# Patient Record
Sex: Female | Born: 1970 | Race: White | Hispanic: No | Smoking: Never smoker
Health system: Southern US, Community
[De-identification: ages and names within clinical notes are randomized; demographics above are authoritative.]

## PROBLEM LIST (undated history)

## (undated) DIAGNOSIS — Z8669 Personal history of other diseases of the nervous system and sense organs: Secondary | ICD-10-CM

## (undated) DIAGNOSIS — K219 Gastro-esophageal reflux disease without esophagitis: Secondary | ICD-10-CM

## (undated) DIAGNOSIS — K429 Umbilical hernia without obstruction or gangrene: Secondary | ICD-10-CM

## (undated) HISTORY — PX: TONSILLECTOMY AND ADENOIDECTOMY: SUR1326

## (undated) HISTORY — PX: BREAST ENHANCEMENT SURGERY: SHX7

## (undated) HISTORY — PX: COLONOSCOPY: SHX174

## (undated) HISTORY — PX: LYMPH NODE BIOPSY: SHX201

---

## 2007-08-14 ENCOUNTER — Emergency Department: Payer: Self-pay | Admitting: Internal Medicine

## 2008-04-24 IMAGING — CT CT STONE STUDY
1 of 2 series · 15 of 32 positions shown, 19 images · non-contrast
Comparison: none

REASON FOR EXAM: flank stone r
COMMENTS:

PROCEDURE:     CT  - CT ABDOMEN /PELVIS WO (STONE)  - August 14, 2007  [DATE]
RESULT:     Comparison: No available comparison exam.
TECHNIQUE: CT examination of the abdomen and pelvis was performed without
contrast. Collimation is 3 mm.

[Series 2: stone · axial · 0.70mm/px · z∈[+66,+484]mm · 15 of 156 slices shown, 19 images]
[im 11/156  soft-tissue]
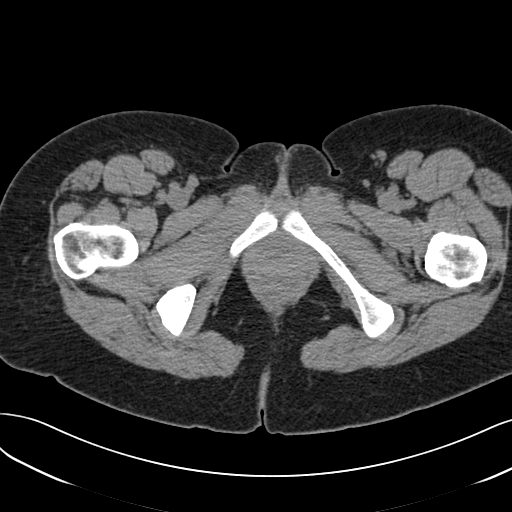
[im 11/156  bone]
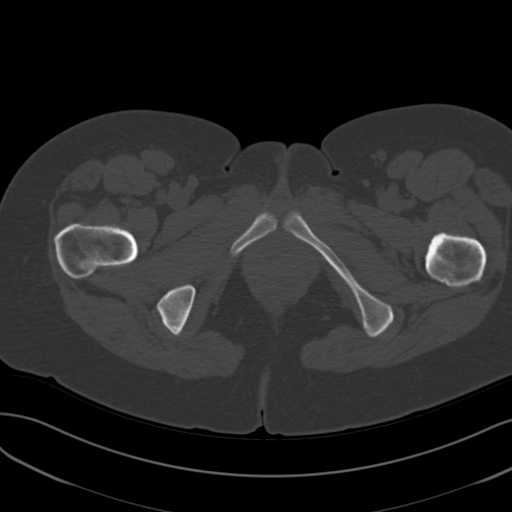
[im 22/156  soft-tissue]
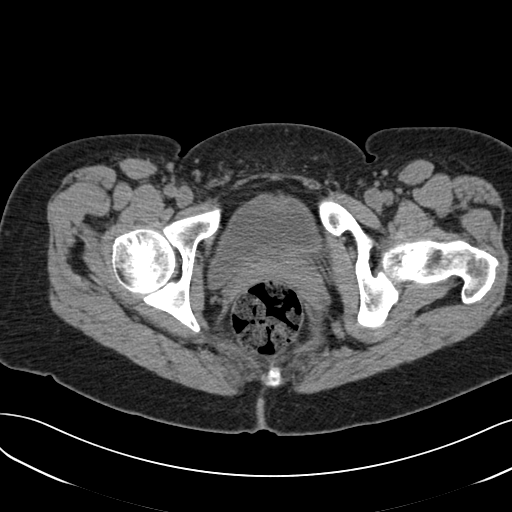
[im 33/156  soft-tissue]
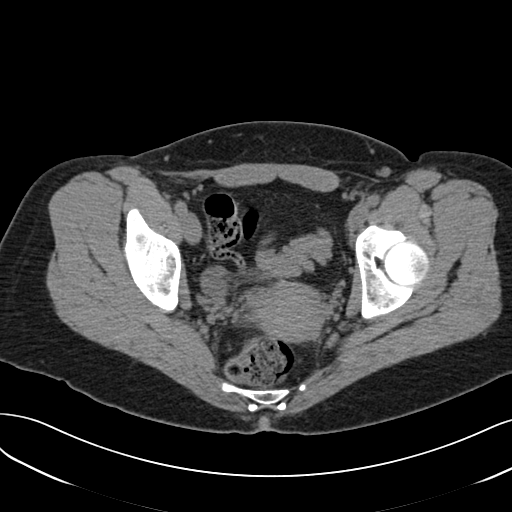
[im 43/156  soft-tissue]
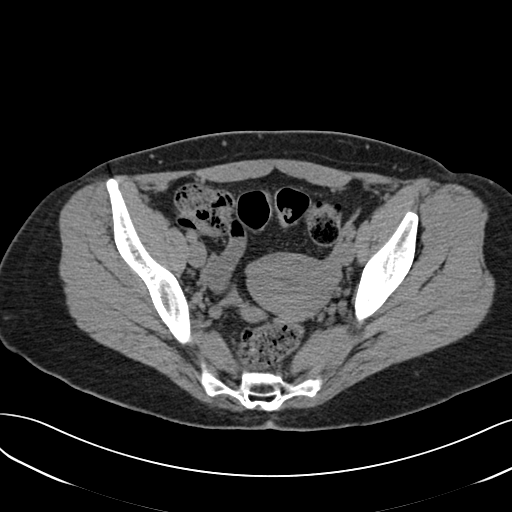
[im 54/156  soft-tissue]
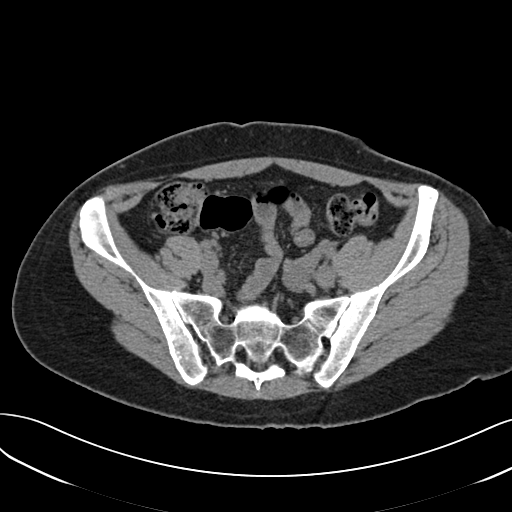
[im 65/156  soft-tissue]
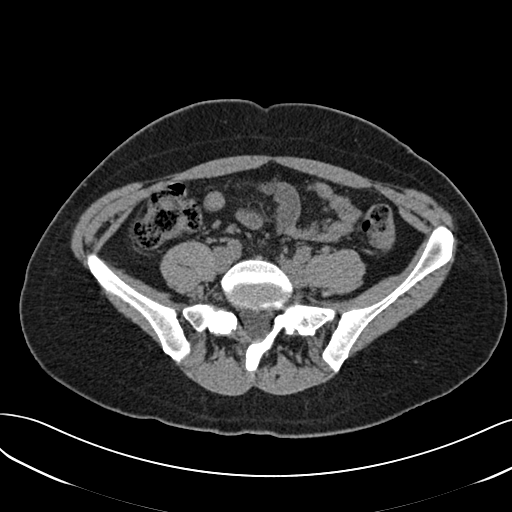
[im 81/156  soft-tissue]
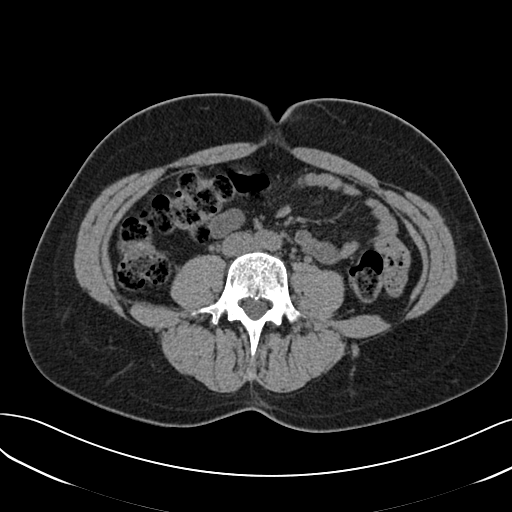
[im 91/156  soft-tissue]
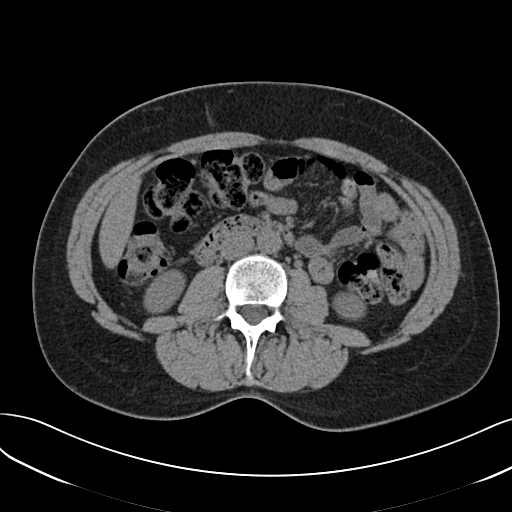
[im 102/156  soft-tissue]
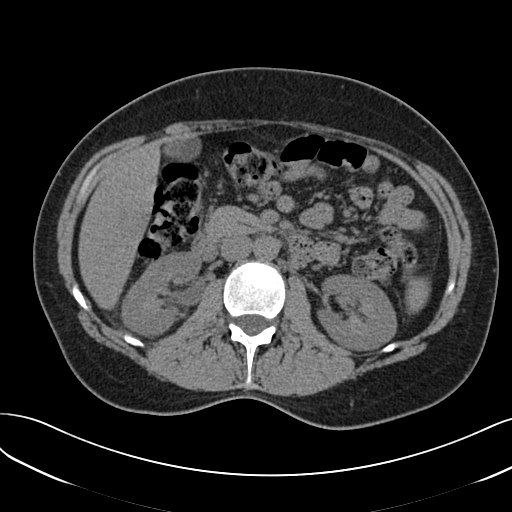
[im 102/156  bone]
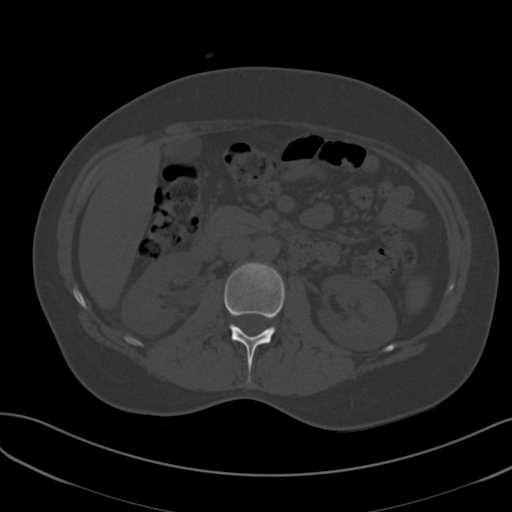
[im 113/156  soft-tissue]
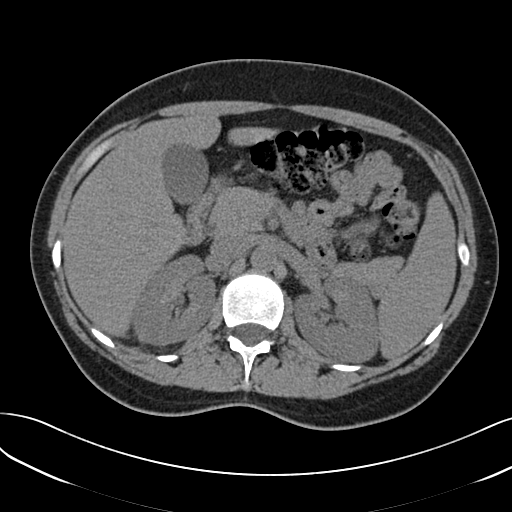
[im 123/156  soft-tissue]
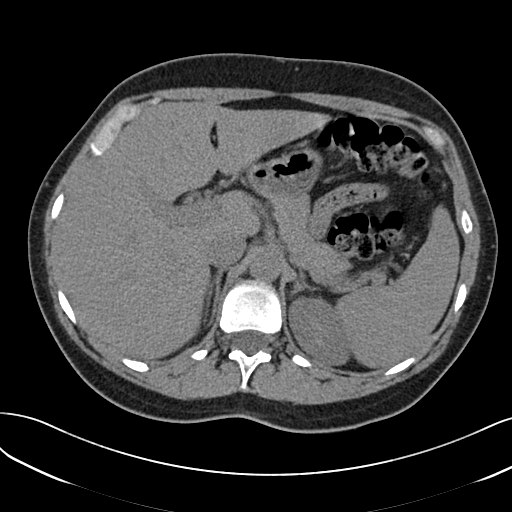
[im 134/156  soft-tissue]
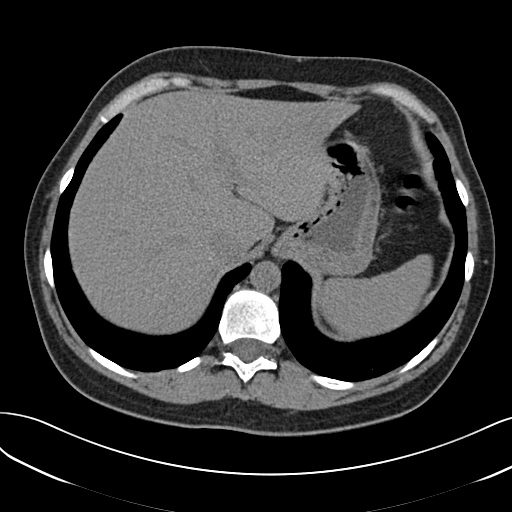
[im 134/156  lung]
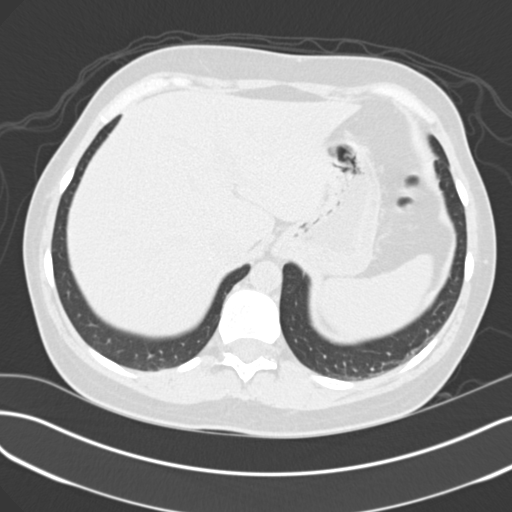
[im 139/156  lung]
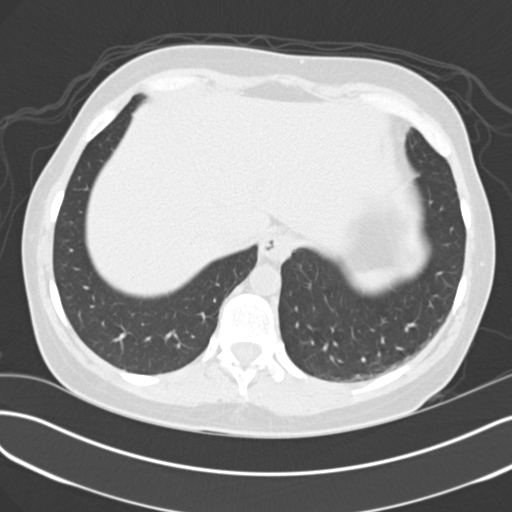
[im 145/156  soft-tissue]
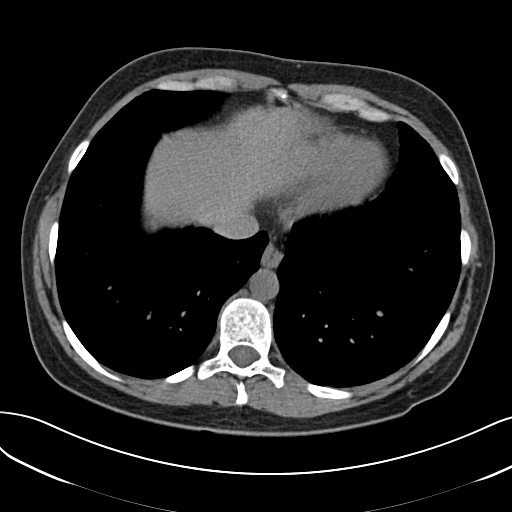
[im 145/156  lung]
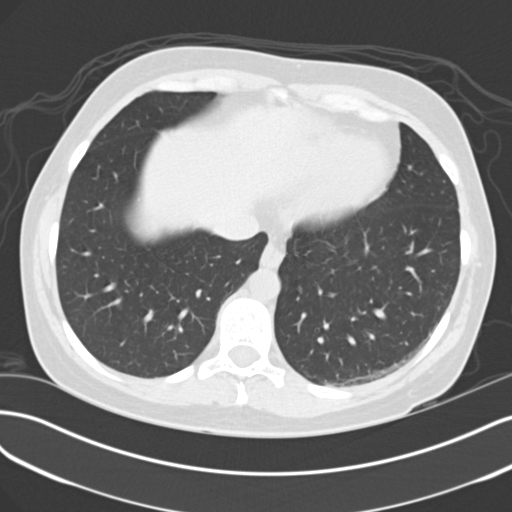
[im 150/156  lung]
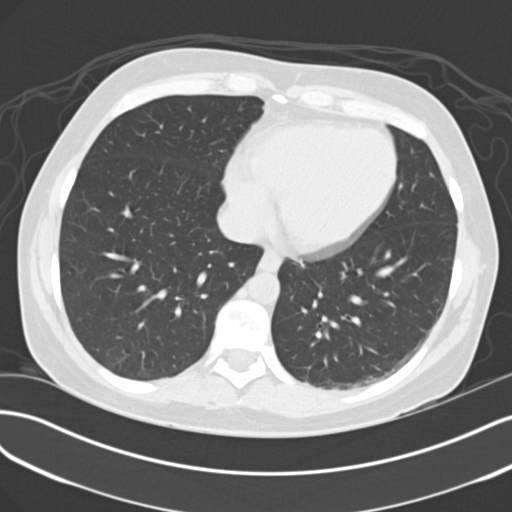

[15 of 32 positions shown; findings below may reference images not displayed]

FINDINGS: Limited evaluation of the lung bases is unremarkable

Evaluation of the abdominal organs, bowels, and vessels is limited without
contrast. The liver, gallbladder, pancreas, and adrenal glands are grossly
unremarkable. The spleen is borderline in size, measuring 13 cm in the AP
dimension. There is a nonobstructive 4 mm right interpolar renal stone. No
ureteral stone is noted. There is no hydroureter or hydronephrosis.

There is no dilatation of the bowels. The appendix is unremarkable. There is
no significant intra abdominal or pelvic fat stranding. There is no
intraperitoneal free air. There is no significant free fluid. There are no
enlarged abdominal pelvic lymph nodes.
IMPRESSION: 1. 4 mm nonobstructive right interpolar renal stone. No ureteral stone is
noted.
2. Borderline sized spleen.
3. Evaluation of the abdominal organs, bowels, and vessels is otherwise
limited without contrast.

## 2011-09-28 ENCOUNTER — Ambulatory Visit: Payer: Self-pay

## 2011-09-28 LAB — HEMATOCRIT: HCT: 39.1 % (ref 35.0–47.0)

## 2011-09-28 LAB — HCG, QUANTITATIVE, PREGNANCY: Beta Hcg, Quant.: <1 m[IU]/mL — ABNORMAL LOW

## 2011-10-07 ENCOUNTER — Ambulatory Visit: Payer: Self-pay

## 2011-10-07 LAB — PREGNANCY, URINE: Pregnancy Test, Urine: NEGATIVE m[IU]/mL

## 2011-10-11 LAB — PATHOLOGY REPORT

## 2014-12-28 NOTE — Op Note (Signed)
PATIENT NAME:  Emma Gross, Emma Gross MR#:  [id number] DATE OF BIRTH:  [birth year]  DATE OF PROCEDURE:  10/07/2011  PREOPERATIVE DIAGNOSIS: Cervical dysplasia.   POSTOPERATIVE DIAGNOSIS: Cervical dysplasia.  PROCEDURE PERFORMED: LEEP cone biopsy, dilation and curettage.   SURGEON: Deloris Pinghilip J. Rosenow, MD  DESCRIPTION OF PROCEDURE: After adequate general anesthesia, the patient was prepped and draped in routine fashion. Approximately 10 mL of 1% Xylocaine with 1:100,000 units of epinephrine were instilled in the cervix. A LEEP cone biopsy was then performed in a "[place] fashion".  The cervix was dilated easily. The uterine cavity was systematically curetted with return of a small amount of tissue. The base of cone biopsy was cauterized with ball cautery. The base of the cone biopsy was then painted with AstrinGyn. The patient tolerated the procedure well and left the Operating Room in good condition. Sponge and needle counts were said to be correct at the end of the procedure. Estimated blood loss was minimal.  ____________________________ Deloris PingPhilip J. Luella Cookosenow, MD pjr:slb D: 10/07/2011 09:18:57 ET T: 10/07/2011 12:07:43 ET JOB#: [id number]  cc: Deloris PingPhilip J. Luella Cookosenow, MD, <Dictator> Towana BadgerPHILIP J ROSENOW MD ELECTRONICALLY SIGNED 10/08/2011 0:21

## 2017-10-12 ENCOUNTER — Other Ambulatory Visit: Payer: Self-pay | Admitting: Nurse Practitioner

## 2017-10-12 DIAGNOSIS — M25511 Pain in right shoulder: Secondary | ICD-10-CM

## 2017-11-13 ENCOUNTER — Other Ambulatory Visit: Payer: Self-pay | Admitting: Orthopedic Surgery

## 2017-11-13 DIAGNOSIS — M7501 Adhesive capsulitis of right shoulder: Secondary | ICD-10-CM

## 2017-11-20 ENCOUNTER — Other Ambulatory Visit: Payer: Self-pay | Admitting: Internal Medicine

## 2017-11-21 ENCOUNTER — Ambulatory Visit: Payer: Self-pay

## 2017-11-21 ENCOUNTER — Other Ambulatory Visit: Payer: Self-pay | Admitting: *Deleted

## 2017-12-08 ENCOUNTER — Ambulatory Visit: Payer: Self-pay

## 2017-12-08 ENCOUNTER — Ambulatory Visit
Admission: RE | Admit: 2017-12-08 | Discharge: 2017-12-08 | Disposition: A | Discharge status: Home / Self Care | Payer: BLUE CROSS/BLUE SHIELD | Source: Ambulatory Visit | Attending: Orthopedic Surgery | Admitting: Orthopedic Surgery

## 2017-12-08 DIAGNOSIS — M7501 Adhesive capsulitis of right shoulder: Secondary | ICD-10-CM | POA: Diagnosis not present

## 2017-12-08 MED ORDER — LIDOCAINE HCL (PF) 1 % IJ SOLN
5.0000 mL | Freq: Once | INTRAMUSCULAR | Status: AC
  Administered 2017-12-08: 5 mL
  Filled 2017-12-08: qty 5

## 2017-12-08 MED ORDER — TRIAMCINOLONE ACETONIDE 40 MG/ML IJ SUSP (RADIOLOGY)
40.0000 mg | Freq: Once | INTRAMUSCULAR | Status: AC
  Administered 2017-12-08: 40 mg via INTRA_ARTICULAR

## 2017-12-08 MED ORDER — ROPIVACAINE HCL 5 MG/ML IJ SOLN
30.0000 mL | Freq: Once | INTRAMUSCULAR | Status: AC
  Administered 2017-12-08: 10 mL via INTRA_ARTICULAR
  Filled 2017-12-08: qty 30

## 2017-12-08 MED ORDER — IOPAMIDOL (ISOVUE-200) INJECTION 41%
50.0000 mL | Freq: Once | INTRAVENOUS | Status: AC | PRN
  Administered 2017-12-08: 10 mL
  Filled 2017-12-08: qty 50

## 2021-04-21 ENCOUNTER — Other Ambulatory Visit: Payer: Self-pay | Admitting: Certified Nurse Midwife

## 2021-04-21 DIAGNOSIS — Z1231 Encounter for screening mammogram for malignant neoplasm of breast: Secondary | ICD-10-CM

## 2021-12-05 ENCOUNTER — Encounter: Payer: Self-pay | Admitting: Family Medicine

## 2021-12-05 ENCOUNTER — Emergency Department: Payer: Managed Care, Other (non HMO)

## 2021-12-05 ENCOUNTER — Inpatient Hospital Stay
Admission: EM | Admit: 2021-12-05 | Discharge: 2021-12-07 | DRG: 378 | Disposition: A | Discharge status: Home / Self Care | Payer: Managed Care, Other (non HMO) | Attending: Internal Medicine | Admitting: Internal Medicine

## 2021-12-05 ENCOUNTER — Other Ambulatory Visit: Payer: Self-pay

## 2021-12-05 DIAGNOSIS — G4700 Insomnia, unspecified: Secondary | ICD-10-CM

## 2021-12-05 DIAGNOSIS — K529 Noninfective gastroenteritis and colitis, unspecified: Secondary | ICD-10-CM | POA: Diagnosis not present

## 2021-12-05 DIAGNOSIS — K625 Hemorrhage of anus and rectum: Principal | ICD-10-CM

## 2021-12-05 DIAGNOSIS — G43909 Migraine, unspecified, not intractable, without status migrainosus: Secondary | ICD-10-CM | POA: Diagnosis present

## 2021-12-05 DIAGNOSIS — K921 Melena: Principal | ICD-10-CM | POA: Diagnosis present

## 2021-12-05 DIAGNOSIS — Z823 Family history of stroke: Secondary | ICD-10-CM | POA: Diagnosis not present

## 2021-12-05 DIAGNOSIS — G43009 Migraine without aura, not intractable, without status migrainosus: Secondary | ICD-10-CM | POA: Diagnosis not present

## 2021-12-05 DIAGNOSIS — K922 Gastrointestinal hemorrhage, unspecified: Secondary | ICD-10-CM

## 2021-12-05 DIAGNOSIS — K559 Vascular disorder of intestine, unspecified: Secondary | ICD-10-CM | POA: Diagnosis present

## 2021-12-05 DIAGNOSIS — Z882 Allergy status to sulfonamides status: Secondary | ICD-10-CM | POA: Diagnosis not present

## 2021-12-05 DIAGNOSIS — Z8249 Family history of ischemic heart disease and other diseases of the circulatory system: Secondary | ICD-10-CM

## 2021-12-05 DIAGNOSIS — F5101 Primary insomnia: Secondary | ICD-10-CM | POA: Diagnosis not present

## 2021-12-05 DIAGNOSIS — E876 Hypokalemia: Secondary | ICD-10-CM

## 2021-12-05 HISTORY — DX: Noninfective gastroenteritis and colitis, unspecified: K52.9

## 2021-12-05 LAB — CBC
HCT: 46.3 % — ABNORMAL HIGH (ref 36.0–46.0)
Hemoglobin: 15.4 g/dL — ABNORMAL HIGH (ref 12.0–15.0)
MCH: 31 pg (ref 26.0–34.0)
MCHC: 33.3 g/dL (ref 30.0–36.0)
MCV: 93.2 fL (ref 80.0–100.0)
Platelets: 362 10*3/uL (ref 150–400)
RBC: 4.97 MIL/uL (ref 3.87–5.11)
RDW: 13 % (ref 11.5–15.5)
WBC: 8.7 10*3/uL (ref 4.0–10.5)
nRBC: 0 % (ref 0.0–0.2)

## 2021-12-05 LAB — COMPREHENSIVE METABOLIC PANEL
ALT: 11 U/L (ref 0–44)
AST: 21 U/L (ref 15–41)
Albumin: 4.3 g/dL (ref 3.5–5.0)
Alkaline Phosphatase: 52 U/L (ref 38–126)
Anion gap: 8 (ref 5–15)
BUN: 11 mg/dL (ref 6–20)
CO2: 23 mmol/L (ref 22–32)
Calcium: 9 mg/dL (ref 8.9–10.3)
Chloride: 105 mmol/L (ref 98–111)
Creatinine, Ser: 0.94 mg/dL (ref 0.44–1.00)
GFR, Estimated: >60 mL/min (ref >60)
Glucose, Bld: 107 mg/dL — ABNORMAL HIGH (ref 70–99)
Potassium: 3 mmol/L — ABNORMAL LOW (ref 3.5–5.1)
Sodium: 136 mmol/L (ref 135–145)
Total Bilirubin: 1.1 mg/dL (ref 0.3–1.2)
Total Protein: 8.5 g/dL — ABNORMAL HIGH (ref 6.5–8.1)

## 2021-12-05 LAB — TYPE AND SCREEN
ABO/RH(D): O NEG
Antibody Screen: NEGATIVE

## 2021-12-05 LAB — PROTIME-INR
INR: 1 (ref 0.8–1.2)
Prothrombin Time: 12.8 seconds (ref 11.4–15.2)

## 2021-12-05 LAB — HEMOGLOBIN AND HEMATOCRIT, BLOOD
HCT: 43.3 % (ref 36.0–46.0)
Hemoglobin: 14.4 g/dL (ref 12.0–15.0)

## 2021-12-05 MED ORDER — AMOXICILLIN-POT CLAVULANATE 875-125 MG PO TABS
1.0000 | ORAL_TABLET | Freq: Once | ORAL | Status: AC
  Administered 2021-12-05: 1 via ORAL
  Filled 2021-12-05: qty 1

## 2021-12-05 MED ORDER — TOPIRAMATE 25 MG PO TABS
25.0000 mg | ORAL_TABLET | Freq: Two times a day (BID) | ORAL | Status: DC
  Administered 2021-12-05 – 2021-12-07 (×4): 25 mg via ORAL
  Filled 2021-12-05 (×4): qty 1

## 2021-12-05 MED ORDER — SODIUM CHLORIDE 0.9 % IV SOLN
3.0000 g | Freq: Four times a day (QID) | INTRAVENOUS | Status: DC
  Administered 2021-12-06 (×2): 3 g via INTRAVENOUS
  Filled 2021-12-05 (×3): qty 8
  Filled 2021-12-05: qty 3

## 2021-12-05 MED ORDER — IOHEXOL 350 MG/ML SOLN
100.0000 mL | Freq: Once | INTRAVENOUS | Status: AC | PRN
  Administered 2021-12-05: 100 mL via INTRAVENOUS

## 2021-12-05 MED ORDER — POTASSIUM CHLORIDE IN NACL 20-0.9 MEQ/L-% IV SOLN
INTRAVENOUS | Status: DC
  Filled 2021-12-05 (×6): qty 1000

## 2021-12-05 MED ORDER — TRAZODONE HCL 50 MG PO TABS
25.0000 mg | ORAL_TABLET | Freq: Every evening | ORAL | Status: DC | PRN
  Administered 2021-12-06: 25 mg via ORAL
  Filled 2021-12-05: qty 1

## 2021-12-05 MED ORDER — ACETAMINOPHEN 650 MG RE SUPP: 650.0000 mg | Freq: Four times a day (QID) | RECTAL | Status: DC | PRN

## 2021-12-05 MED ORDER — ACETAMINOPHEN 325 MG PO TABS: 650.0000 mg | ORAL_TABLET | Freq: Four times a day (QID) | ORAL | Status: DC | PRN

## 2021-12-05 MED ORDER — ONDANSETRON HCL 4 MG/2ML IJ SOLN: 4.0000 mg | Freq: Four times a day (QID) | INTRAMUSCULAR | Status: DC | PRN

## 2021-12-05 MED ORDER — ONDANSETRON HCL 4 MG PO TABS: 4.0000 mg | ORAL_TABLET | Freq: Four times a day (QID) | ORAL | Status: DC | PRN

## 2021-12-05 MED ORDER — MORPHINE SULFATE (PF) 2 MG/ML IV SOLN: 2.0000 mg | INTRAVENOUS | Status: DC | PRN

## 2021-12-05 NOTE — ED Provider Notes (Addendum)
? ?[facility] ?Provider Note ? ? ? Event Date/Time  ? First MD Initiated Contact with Patient 12/05/21 2042   ?  (approximate) ? ?History  ? ?Chief Complaint: Rectal Bleeding ? ?HPI ? ?Emma Gross is a 51 y.o. female with no significant past medical history who presents to the emergency department for rectal bleeding.  According to the patient she had a colonoscopy 11/19/2021 with no concerning findings.  No polyps nothing removed.  Patient states last night she developed rectal bleeding when she noticed some blood in her stool.  Today she states she has had 3-4 bowel movements of straight blood with no stool.  Bright red blood.  States some nausea but denies any vomiting.  Denies any abdominal pain.  No anticoagulation. ? ?Physical Exam  ? ?Triage Vital Signs: ?ED Triage Vitals  ?Enc Vitals Group  ?   BP 12/05/21 2033 (!) 189/94  ?   Pulse Rate 12/05/21 2033 (!) 103  ?   Resp 12/05/21 2033 20  ?   Temp 12/05/21 2033 97.9 ?F (36.6 ?C)  ?   Temp Source 12/05/21 2033 Oral  ?   SpO2 12/05/21 2033 100 %  ?   Weight 12/05/21 2034 200 lb (90.7 kg)  ?   Height 12/05/21 2034 5\' 8"  (1.727 m)  ?   Head Circumference --   ?   Peak Flow --   ?   Pain Score 12/05/21 2033 0  ?   Pain Loc --   ?   Pain Edu? --   ?   Excl. in GC? --   ? ? ?Most recent vital signs: ?Vitals:  ? 12/05/21 2033  ?BP: (!) 189/94  ?Pulse: (!) 103  ?Resp: 20  ?Temp: 97.9 ?F (36.6 ?C)  ?SpO2: 100%  ? ? ?General: Awake, no distress.  ?CV:  Good peripheral perfusion.  Regular rate and rhythm  ?Resp:  Normal effort.  Equal breath sounds bilaterally.  ?Abd:  No distention.  Soft, nontender.  No rebound or guarding. ? ? ?ED Results / Procedures / Treatments  ? ?RADIOLOGY ? ?I personally reviewed the CT imaging, no significant finding on my evaluation. ?Radiology is read the CT is possible descending colitis.  I do also state possibility of a neoplasm however patient just had a colonoscopy 2 weeks ago. ? ? ?MEDICATIONS ORDERED IN  ED: ?Medications - No data to display ? ? ?IMPRESSION / MDM / ASSESSMENT AND PLAN / ED COURSE  ?I reviewed the triage vital signs and the nursing notes. ? ?Patient presents to the emergency department for rectal bleeding.  Patient had a colonoscopy 11/19/2021 with no concerning findings per patient.  Nothing removed.  Patient states rectal bleeding since last night.  Benign abdomen.  Patient did have a bowel movement in the emergency department of bright red blood.  We will obtain labs including coags, CT angio GI protocol of the abdomen and continue to closely monitor.  Given the patient's rectal bleeding anticipate admission to the hospital for further work-up and treatment. ? ?CT is read as possible colitis.  We will cover with Cipro and Flagyl.  Lab work is overall reassuring.  CBC shows normal H&H.  Chemistry is normal.  INR is normal.  We will admit to the hospitalist service for ongoing treatment and monitoring.  Patient agreeable to plan of care. ? ?FINAL CLINICAL IMPRESSION(S) / ED DIAGNOSES  ? ?GI bleeding ?Colitis ? ?Note:  This document was prepared using 11/21/2021  and may include unintentional dictation errors. ?  Minna Antis, MD ?12/05/21 2217 ? ?  ?Minna Antis, MD ?12/05/21 2217 ? ?

## 2021-12-05 NOTE — ED Triage Notes (Signed)
Rectal bleeding starting yesterday. Reports she had multiple episodes of diarrhea yesterday. No blood present in stool at that time. Then " blood had been just pouring out without any bowel movement." Pt had colonoscopy on 17th that was unremarkable. Having nausea and dizziness since onset of sx.  ?

## 2021-12-05 NOTE — ED Notes (Signed)
Request made for transport to the floor ?

## 2021-12-05 NOTE — Assessment & Plan Note (Addendum)
Takes Ambien at home. ?

## 2021-12-05 NOTE — H&P (Addendum)
?  ?  ?[place] ? ? ?PATIENT NAME: Emma Gross   ? ?MR#:  [id number] ? ?DATE OF BIRTH:  [birth year] ? ?DATE OF ADMISSION:  12/05/2021 ? ?PRIMARY CARE PHYSICIAN: Leanna Sato, MD  ? ?Patient is coming from: Home ? ?REQUESTING/REFERRING PHYSICIAN: Minna Antis, MD ? ?CHIEF COMPLAINT:  ? ?Chief Complaint  ?Patient presents with  ?? Rectal Bleeding  ? ? ?HISTORY OF PRESENT ILLNESS:  ?Emma Gross is a 51 y.o. Caucasian female with medical history significant for migraine, vitamin B12 deficiency and insomnia, who presented to the emergency room with acute onset of bright red bleeding per rectum.  The patient started having mixed stools with bright red blood yesterday and today had a it has been mainly bright red blood with associated abdominal cramps as well as nausea without vomiting or heartburn.  She denies any fever or chills.  She denied any dysuria, oliguria or hematuria, urgency or frequency or flank pain.  No cough or wheezing or hemoptysis.  No chest pain or palpitations.  No other bleeding diathesis.  She admits to feeling tired today.  No paresthesias or focal muscle weakness or headache or dizziness or presyncope or syncope. ? ?ED Course: When she came to the ER, BP was 189/94 with a heart rate of 103 with otherwise normal vital signs.  Labs revealed hypokalemia of 3 and otherwise unremarkable CMP.  CBC showed hemoglobin of 15.4 hematocrit 46.3.  PT was 12.8 and INR 1 ?Imaging: CTA of the abdomen and pelvis showed the following: ?1. No focal area of active gastrointestinal bleeding is ?demonstrated. ?2. Focal area of wall thickening with mild pericolonic stranding is ?suggested in the mid descending colon. This may indicate an area of ?inflammation such as diverticulitis or may indicate an underlying ?colon neoplasm. Consider endoscopy for further evaluation. No ?abscess. ?3. Nonobstructing intrarenal stones on the right ? ?The patient was given p.o. Augmentin in the ER.  She will be admitted to  a medical telemetry bed for further evaluation and management. ?PAST MEDICAL HISTORY:  ? ?Migraine, vitamin B12 deficiency and insomnia. ? ?PAST SURGICAL HISTORY:  ?Tonsillectomy and adenoidectomy ?Breast implants/augmentation ? ?SOCIAL HISTORY:  ? ?Social History  ? ?Tobacco Use  ?? Smoking status: Never  ?? Smokeless tobacco: Never  ?Substance Use Topics  ?? Alcohol use: Not on file  ?She drinks alcohol socially.  No history of tobacco abuse or illicit drug use. ? ?FAMILY HISTORY:  ?Positive for CVA in her father and peripheral vascular disease in her deceased mother who had lower extremity amputations. ? ?DRUG ALLERGIES:  ? ?Allergies  ?Allergen Reactions  ?? Sulfa Antibiotics   ? ? ?REVIEW OF SYSTEMS:  ? ?ROS ?As per history of present illness. All pertinent systems were reviewed above. Constitutional, HEENT, cardiovascular, respiratory, GI, GU, musculoskeletal, neuro, psychiatric, endocrine, integumentary and hematologic systems were reviewed and are otherwise negative/unremarkable except for positive findings mentioned above in the HPI. ? ? ?MEDICATIONS AT HOME:  ? ?Prior to Admission medications   ?Medication Sig Start Date End Date Taking? Authorizing Provider  ?phentermine (ADIPEX-P) 37.5 MG tablet Take 37.5 mg by mouth daily. 10/14/21   [provider]  ?topiramate (TOPAMAX) 25 MG tablet Take 25 mg by mouth 2 (two) times daily. 10/26/21   [provider]  ?zolpidem (AMBIEN CR) 12.5 MG CR tablet Take 12.5 mg by mouth at bedtime as needed. 11/09/21   [provider]  ? ?  ? ?VITAL SIGNS:  ?Blood pressure (!) 168/78, pulse 68,  temperature 98.9 ?F (37.2 ?C), temperature source Oral, resp. rate 18, height 5\' 8"  (1.727 m), weight 90.7 kg, SpO2 100 %. ? ?PHYSICAL EXAMINATION:  ?Physical Exam ? ?GENERAL:  51 y.o.-year-old Caucasian female patient lying in the bed with no acute distress.  ?EYES: Pupils equal, round, reactive to light and accommodation. No scleral icterus. Extraocular muscles  intact.  ?HEENT: Head atraumatic, normocephalic. Oropharynx and nasopharynx clear.  ?NECK:  Supple, no jugular venous distention. No thyroid enlargement, no tenderness.  ?LUNGS: Normal breath sounds bilaterally, no wheezing, rales,rhonchi or crepitation. No use of accessory muscles of respiration.  ?CARDIOVASCULAR: Regular rate and rhythm, S1, S2 normal. No murmurs, rubs, or gallops.  ?ABDOMEN: Soft, nondistended, with mild left upper and lower quadrant tenderness without rebound tenderness guarding or rigidity.. Bowel sounds present. No organomegaly or mass.  ?EXTREMITIES: No pedal edema, cyanosis, or clubbing.  ?NEUROLOGIC: Cranial nerves II through XII are intact. Muscle strength 5/5 in all extremities. Sensation intact. Gait not checked.  ?PSYCHIATRIC: The patient is alert and oriented x 3.  Normal affect and good eye contact. ?SKIN: No obvious rash, lesion, or ulcer.  ? ?LABORATORY PANEL:  ? ?CBC ?Recent Labs  ?Lab 12/05/21 ?2038 12/05/21 ?2324  ?WBC 8.7  --   ?HGB 15.4* 14.4  ?HCT 46.3* 43.3  ?PLT 362  --   ? ?------------------------------------------------------------------------------------------------------------------ ? ?Chemistries  ?Recent Labs  ?Lab 12/05/21 ?2038  ?NA 136  ?K 3.0*  ?CL 105  ?CO2 23  ?GLUCOSE 107*  ?BUN 11  ?CREATININE 0.94  ?CALCIUM 9.0  ?AST 21  ?ALT 11  ?ALKPHOS 52  ?BILITOT 1.1  ? ?------------------------------------------------------------------------------------------------------------------ ? ?Cardiac Enzymes ?No results for input(s): TROPONINI in the last 168 hours. ?------------------------------------------------------------------------------------------------------------------ ? ?RADIOLOGY:  ?CT ANGIO GI BLEED ? ?Result Date: 12/05/2021 ?CLINICAL DATA:  Lower GI bleed. Rectal bleeding starting yesterday. Multiple episodes of diarrhea yesterday. EXAM: CTA ABDOMEN AND PELVIS WITHOUT AND WITH CONTRAST TECHNIQUE: Multidetector CT imaging of the abdomen and pelvis was performed  using the standard protocol during bolus administration of intravenous contrast. Multiplanar reconstructed images and MIPs were obtained and reviewed to evaluate the vascular anatomy. RADIATION DOSE REDUCTION: This exam was performed according to the departmental dose-optimization program which includes automated exposure control, adjustment of the mA and/or kV according to patient size and/or use of iterative reconstruction technique. CONTRAST:  02/04/2022 OMNIPAQUE IOHEXOL 350 MG/ML SOLN COMPARISON:  CT 08/14/2007 FINDINGS: VASCULAR Aorta: Normal caliber aorta without aneurysm, dissection, vasculitis or significant stenosis. Celiac: Patent without evidence of aneurysm, dissection, vasculitis or significant stenosis. SMA: Patent without evidence of aneurysm, dissection, vasculitis or significant stenosis. Renals: Duplicated renal arteries bilaterally. Renal arteries are patent. No aneurysm, dissection, or significant stenosis. IMA: Patent without evidence of aneurysm, dissection, vasculitis or significant stenosis. Inflow: Patent without evidence of aneurysm, dissection, vasculitis or significant stenosis. Proximal Outflow: Bilateral common femoral and visualized portions of the superficial and profunda femoral arteries are patent without evidence of aneurysm, dissection, vasculitis or significant stenosis. Veins: No obvious venous abnormality within the limitations of this arterial phase study. Review of the MIP images confirms the above findings. NON-VASCULAR Lower chest: Mild dependent changes in the lung bases. Hepatobiliary: No focal liver abnormality is seen. No gallstones, gallbladder wall thickening, or biliary dilatation. Pancreas: Unremarkable. No pancreatic ductal dilatation or surrounding inflammatory changes. Spleen: Normal in size without focal abnormality. Adrenals/Urinary Tract: No adrenal gland nodules. Renal nephrograms are symmetrical. Right intrarenal stones are identified, largest measuring about 5  mm diameter. No hydronephrosis or hydroureter. No ureteral stones  are seen. Bladder is unremarkable. Stomach/Bowel: Residual contrast material in the cecum and ascending colon. Stomach, small bowel, and

## 2021-12-05 NOTE — Progress Notes (Signed)
Pharmacy Antibiotic Note ? ?Emma Gross is a 51 y.o. female admitted on 12/05/2021 with  intra-abdominal infection .  Pharmacy has been consulted for Unasyn dosing. ? ?Plan: ?Augmentin 875/125 mg given on 4/2 @ 2223. ?Unasyn 3 gm IV Q6H ordered to start on 4/3 @ 0400.  ? ?Height: 5\' 8"  (172.7 cm) ?Weight: 90.7 kg (200 lb) ?IBW/kg (Calculated) : 63.9 ? ?Temp (24hrs), Avg:97.9 ?F (36.6 ?C), Min:97.9 ?F (36.6 ?C), Max:97.9 ?F (36.6 ?C) ? ?Recent Labs  ?Lab 12/05/21 ?2038  ?WBC 8.7  ?CREATININE 0.94  ?  ?Estimated Creatinine Clearance: 82.4 mL/min (by C-G formula based on SCr of 0.94 mg/dL).   ? ?Allergies  ?Allergen Reactions  ? Sulfa Antibiotics   ? ? ?Antimicrobials this admission: ?  >>  ?  >>  ? ?Dose adjustments this admission: ? ? ?Microbiology results: ? BCx:  ? UCx:   ? Sputum:   ? MRSA PCR:  ? ?Thank you for allowing pharmacy to be a part of this patient?s care. ? ?Alyrica Thurow D ?12/05/2021 11:04 PM ? ?

## 2021-12-05 NOTE — Assessment & Plan Note (Addendum)
Try to limit Imitrex.  Continue Topamax ?

## 2021-12-05 NOTE — Assessment & Plan Note (Deleted)
Stool comprehensive panel negative.  Could be ischemic colitis.  Had recent colonoscopy.  On empiric Unasyn.  Full liquid diet.  Observing for now. Hemoglobin drifting down but getting IV fluids.  As per pharmacist case reports with ischemic colitis with sumatriptan use.  Continue to monitor. ?

## 2021-12-06 ENCOUNTER — Encounter: Payer: Self-pay | Admitting: Family Medicine

## 2021-12-06 DIAGNOSIS — K921 Melena: Principal | ICD-10-CM

## 2021-12-06 DIAGNOSIS — E876 Hypokalemia: Secondary | ICD-10-CM

## 2021-12-06 DIAGNOSIS — G4700 Insomnia, unspecified: Secondary | ICD-10-CM

## 2021-12-06 LAB — CBC
HCT: 41.8 % (ref 36.0–46.0)
Hemoglobin: 13.9 g/dL (ref 12.0–15.0)
MCH: 31 pg (ref 26.0–34.0)
MCHC: 33.3 g/dL (ref 30.0–36.0)
MCV: 93.1 fL (ref 80.0–100.0)
Platelets: 315 10*3/uL (ref 150–400)
RBC: 4.49 MIL/uL (ref 3.87–5.11)
RDW: 13.1 % (ref 11.5–15.5)
WBC: 6.1 10*3/uL (ref 4.0–10.5)
nRBC: 0 % (ref 0.0–0.2)

## 2021-12-06 LAB — BASIC METABOLIC PANEL
Anion gap: 7 (ref 5–15)
BUN: 10 mg/dL (ref 6–20)
CO2: 24 mmol/L (ref 22–32)
Calcium: 8.4 mg/dL — ABNORMAL LOW (ref 8.9–10.3)
Chloride: 107 mmol/L (ref 98–111)
Creatinine, Ser: 0.84 mg/dL (ref 0.44–1.00)
GFR, Estimated: >60 mL/min (ref >60)
Glucose, Bld: 97 mg/dL (ref 70–99)
Potassium: 3.2 mmol/L — ABNORMAL LOW (ref 3.5–5.1)
Sodium: 138 mmol/L (ref 135–145)

## 2021-12-06 LAB — GASTROINTESTINAL PANEL BY PCR, STOOL (REPLACES STOOL CULTURE)

## 2021-12-06 LAB — HEMOGLOBIN AND HEMATOCRIT, BLOOD
HCT: 40.4 % (ref 36.0–46.0)
Hemoglobin: 13.4 g/dL (ref 12.0–15.0)

## 2021-12-06 LAB — HIV ANTIBODY (ROUTINE TESTING W REFLEX): HIV Screen 4th Generation wRfx: NONREACTIVE

## 2021-12-06 LAB — MAGNESIUM: Magnesium: 2 mg/dL (ref 1.7–2.4)

## 2021-12-06 MED ORDER — SUMATRIPTAN SUCCINATE 50 MG PO TABS: 50.0000 mg | ORAL_TABLET | ORAL | Status: DC

## 2021-12-06 MED ORDER — POTASSIUM CHLORIDE CRYS ER 20 MEQ PO TBCR
40.0000 meq | EXTENDED_RELEASE_TABLET | Freq: Once | ORAL | Status: AC
Start: 2021-12-06 — End: 2021-12-06
  Administered 2021-12-06: 40 meq via ORAL
  Filled 2021-12-06: qty 2

## 2021-12-06 NOTE — Consult Note (Addendum)
? ? ?[facility] GI Inpatient Consult Note ? ? ?Kathline Magic, M.D. ? ?Reason for Consult: bloody diarrhea, colitis on CT ?  ?Attending Requesting Consult: Eugenie Norrie, M.D. ? ? ?History of Present Illness: ?Emma Gross is a 51 y.o. female is a pleasant female on whom I had the pleasure of performing: A screening colonoscopy on November 19, 2021.  The procedure was reviewed and noted to be normal except for a mid ascending colon, single diverticulum without evidence of diverticulitis.  No polyps, inflammation or masses were noted.  No specimens were collected.  No significant hemorrhoids were noted on retroflexed view. ?Patient had been doing well for the last couple of weeks since her colonoscopy when 2 days ago, while barbecuing outside, she noticed abdominal cramping after eating her meal.  She had significant fecal urgency and had "a large amount of brown diarrhea" followed by more abdominal cramping.  Patient took Pepto-Bismol without significant relief and then subsequently went to the bathroom and passing "large amount of red blood without any stool".  This continued throughout Saturday evening into Sunday.  Patient presented to the emergency room yesterday with complaints of lower abdominal cramping and continued bleeding although it had tapered off somewhat.  She was admitted to the hospital with observed hematochezia for further evaluation and management. ?Today, the patient has mild left lower quadrant cramping and had 1 dark red bowel movement this morning although the frequency is much less than yesterday.  She has no severe abdominal pain, nausea or vomiting. ?In regards to the cookout, the patient denies any other sick contacts or that anyone who ate with her also became ill.  It was her, her spouse and her in-laws over at the time and they all ate the same food. ? ?Past Medical History:  ?History reviewed. No pertinent past medical history.  ?Problem List: ?Patient Active Problem List  ?  Diagnosis Date Noted  ? Hypokalemia 12/06/2021  ? Acute hemorrhagic colitis 12/05/2021  ? Migraine 12/05/2021  ? Insomnia 12/05/2021  ?  ?Past Surgical History: ?History reviewed. No pertinent surgical history.  ?Allergies: ?Allergies  ?Allergen Reactions  ? Sulfa Antibiotics   ?  Other reaction(s): Unknown ?Childhood allergy  ?  ?Home Medications: ?Medications Prior to Admission  ?Medication Sig Dispense Refill Last Dose  ? phentermine (ADIPEX-P) 37.5 MG tablet Take 37.5 mg by mouth daily. (Patient not taking: Reported on 12/06/2021)   Not Taking  ? SUMAtriptan (IMITREX) 100 MG tablet Take 50 to 100 mg by mouth at onset of migraine. May repeat in 2 hours. Max of 2 doses per day.   prn at unknown  ? topiramate (TOPAMAX) 25 MG tablet Take 25 mg by mouth 2 (two) times daily.   prn at unknown  ? zolpidem (AMBIEN CR) 12.5 MG CR tablet Take 12.5 mg by mouth at bedtime as needed.   12/04/2021  ? ?Home medication reconciliation was completed with the patient.  ? ?Scheduled Inpatient Medications: ?  ? potassium chloride  40 mEq Oral Once  ? SUMAtriptan  50 mg Oral UD  ? topiramate  25 mg Oral BID  ? ? ?Continuous Inpatient Infusions: ?  ? 0.9 % NaCl with KCl 20 mEq / L 100 mL/hr at 12/05/21 2342  ? ampicillin-sulbactam (UNASYN) IV 3 g (12/06/21 0456)  ? ? ?PRN Inpatient Medications:  ?acetaminophen **OR** acetaminophen, morphine injection, ondansetron **OR** ondansetron (ZOFRAN) IV, traZODone ? ?Family History: ?family history is not on file.  ? ?GI Family History: Negative ? ?  Social History:  ? reports that she has never smoked. She has never used smokeless tobacco. The patient denies ETOH, tobacco, or drug use.  ? ? ?Review of Systems: Review of Systems - Negative except that in HPI. ? ?Physical Examination: ?BP (!) 141/85 (BP Location: Right Arm)   Pulse 70   Temp 98.9 ?F (37.2 ?C)   Resp 16   Ht 5\' 8"  (1.727 m)   Wt 90.7 kg   SpO2 97%   BMI 30.41 kg/m?  ?Physical Exam ?Vitals reviewed.  ?Constitutional:   ?    General: She is not in acute distress. ?   Appearance: Normal appearance. She is not ill-appearing or toxic-appearing.  ?HENT:  ?   Head: Normocephalic and atraumatic.  ?   Nose: Nose normal.  ?   Mouth/Throat:  ?   Mouth: Mucous membranes are dry.  ?Eyes:  ?   General: No scleral icterus.    ?   Right eye: No discharge.     ?   Left eye: No discharge.  ?   Pupils: Pupils are equal, round, and reactive to light.  ?Cardiovascular:  ?   Rate and Rhythm: Normal rate.  ?   Pulses: Normal pulses.  ?   Heart sounds: Normal heart sounds. No murmur heard. ?  No gallop.  ?Pulmonary:  ?   Effort: Pulmonary effort is normal.  ?   Breath sounds: Normal breath sounds. No wheezing.  ?Abdominal:  ?   General: Bowel sounds are normal.  ?   Palpations: Abdomen is soft. There is no mass.  ?   Tenderness: There is abdominal tenderness. There is no guarding or rebound.  ?   Hernia: No hernia is present.  ?Musculoskeletal:  ?   Cervical back: Normal range of motion.  ?Skin: ?   General: Skin is warm and dry.  ?   Capillary Refill: Capillary refill takes less than 2 seconds.  ?Neurological:  ?   General: No focal deficit present.  ?   Mental Status: She is alert and oriented to person, place, and time.  ?Psychiatric:     ?   Mood and Affect: Mood normal.     ?   Behavior: Behavior normal.     ?   Thought Content: Thought content normal.     ?   Judgment: Judgment normal.  ? ? ?Data: ?Lab Results  ?Component Value Date  ? WBC 6.1 12/06/2021  ? HGB 13.9 12/06/2021  ? HCT 41.8 12/06/2021  ? MCV 93.1 12/06/2021  ? PLT 315 12/06/2021  ? ?Recent Labs  ?Lab 12/05/21 ?2038 12/05/21 ?2324 12/06/21 ?[id number]  ?HGB 15.4* 14.4 13.9  ? ?Lab Results  ?Component Value Date  ? NA 138 12/06/2021  ? K 3.2 (L) 12/06/2021  ? CL 107 12/06/2021  ? CO2 24 12/06/2021  ? BUN 10 12/06/2021  ? CREATININE 0.84 12/06/2021  ? ?Lab Results  ?Component Value Date  ? ALT 11 12/05/2021  ? AST 21 12/05/2021  ? ALKPHOS 52 12/05/2021  ? BILITOT 1.1 12/05/2021  ? ?Recent Labs   ?Lab 12/05/21 ?2038  ?INR 1.0  ? ? ?  Latest Ref Rng & Units 12/06/2021  ?  5:04 AM 12/05/2021  ? 11:24 PM 12/05/2021  ?  8:38 PM  ?CBC  ?WBC 4.0 - 10.5 K/uL 6.1    8.7    ?Hemoglobin 12.0 - 15.0 g/dL 13.9   14.4   15.4    ?Hematocrit 36.0 - 46.0 % 41.8  43.3   46.3    ?Platelets 150 - 400 K/uL 315    362    ? ? ?STUDIES: ?CT ANGIO GI BLEED ? ?Result Date: 12/05/2021 ?CLINICAL DATA:  Lower GI bleed. Rectal bleeding starting yesterday. Multiple episodes of diarrhea yesterday. EXAM: CTA ABDOMEN AND PELVIS WITHOUT AND WITH CONTRAST TECHNIQUE: Multidetector CT imaging of the abdomen and pelvis was performed using the standard protocol during bolus administration of intravenous contrast. Multiplanar reconstructed images and MIPs were obtained and reviewed to evaluate the vascular anatomy. RADIATION DOSE REDUCTION: This exam was performed according to the departmental dose-optimization program which includes automated exposure control, adjustment of the mA and/or kV according to patient size and/or use of iterative reconstruction technique. CONTRAST:  19mL OMNIPAQUE IOHEXOL 350 MG/ML SOLN COMPARISON:  CT 08/14/2007 FINDINGS: VASCULAR Aorta: Normal caliber aorta without aneurysm, dissection, vasculitis or significant stenosis. Celiac: Patent without evidence of aneurysm, dissection, vasculitis or significant stenosis. SMA: Patent without evidence of aneurysm, dissection, vasculitis or significant stenosis. Renals: Duplicated renal arteries bilaterally. Renal arteries are patent. No aneurysm, dissection, or significant stenosis. IMA: Patent without evidence of aneurysm, dissection, vasculitis or significant stenosis. Inflow: Patent without evidence of aneurysm, dissection, vasculitis or significant stenosis. Proximal Outflow: Bilateral common femoral and visualized portions of the superficial and profunda femoral arteries are patent without evidence of aneurysm, dissection, vasculitis or significant stenosis. Veins: No obvious  venous abnormality within the limitations of this arterial phase study. Review of the MIP images confirms the above findings. NON-VASCULAR Lower chest: Mild dependent changes in the lung bases. Hepatobiliary: No focal

## 2021-12-06 NOTE — Assessment & Plan Note (Addendum)
Rectal bleeding.  Could be ischemic colitis.  Discontinue antibiotics and stool comprehensive panel negative.  This is not C. difficile with focal bleeding.  Patient had no further bleeding and tolerated diet discharged home in stable condition.  Hemoglobin upon discharge 13.5. ?

## 2021-12-06 NOTE — Assessment & Plan Note (Addendum)
Potassium supplementation in the normal range. ?

## 2021-12-06 NOTE — Progress Notes (Signed)
?  Progress Note ? ? ?Patient: Emma Gross QTM:[id number] DOB: 12-14-1970 DOA: 12/05/2021     1 ?DOS: the patient was seen and examined on 12/06/2021 ?  ? ? ?Assessment and Plan: ?* Hematochezia ?Rectal bleeding.  Could be ischemic colitis.  Discontinue antibiotics and stool comprehensive panel negative.  This is not C. difficile with focal bleeding.  Need to monitor here in the hospital until bleeding subsides.  Pharmacist had noted Imitrex can potentially cause ischemic colitis. ? ?Hypokalemia ?Replace potassium and IV fluids and orally ? ?Migraine ?Try to limit Imitrex.  Continue Topamax ? ?Insomnia ?Prn Trazodone while in the hospital ? ? ? ? ?  ? ?Subjective: Patient had a colonoscopy recently.  Patient started having diarrhea and then started rectal bleeding.  Colonoscopy reported as negative.  No procedures done. ? ?Physical Exam: ?Vitals:  ? 12/05/21 2320 12/06/21 0500 12/06/21 0810 12/06/21 1122  ?BP: (!) 168/78 (!) 157/87 (!) 141/85 (!) 146/83  ?Pulse: 68 68 70 72  ?Resp: 18 18 16 18   ?Temp: 98.9 ?F (37.2 ?C) 98.4 ?F (36.9 ?C) 98.9 ?F (37.2 ?C) 98.2 ?F (36.8 ?C)  ?TempSrc: Oral Oral    ?SpO2: 100% 99% 97% 97%  ?Weight:      ?Height:      ? ?Physical Exam ?HENT:  ?   Head: Normocephalic.  ?   Mouth/Throat:  ?   Pharynx: No oropharyngeal exudate.  ?Eyes:  ?   General: Lids are normal.  ?   Conjunctiva/sclera: Conjunctivae normal.  ?Cardiovascular:  ?   Rate and Rhythm: Normal rate and regular rhythm.  ?   Heart sounds: Normal heart sounds, S1 normal and S2 normal.  ?Pulmonary:  ?   Breath sounds: No decreased breath sounds, wheezing, rhonchi or rales.  ?Abdominal:  ?   Tenderness: There is abdominal tenderness in the right lower quadrant, suprapubic area and left lower quadrant.  ?Musculoskeletal:  ?   Right lower leg: No swelling.  ?   Left lower leg: No swelling.  ?Skin: ?   General: Skin is warm.  ?   Findings: No rash.  ?Neurological:  ?   Mental Status: She is alert and oriented to person, place, and  time.  ?  ?Data Reviewed: ?CT scan of the abdomen reviewed.  Last hemoglobin 13.4.  Last potassium 3.2 ? ?Disposition: ?Status is: Inpatient ?Remains inpatient appropriate because: Had some rectal bleeding today.  Continue to monitor. ? ?Planned Discharge Destination: Home ? ? ?Author: ? , MD ?12/06/2021 3:00 PM ? ?For on call review [email or web address].  ?

## 2021-12-07 LAB — BASIC METABOLIC PANEL
Anion gap: 8 (ref 5–15)
BUN: 7 mg/dL (ref 6–20)
CO2: 20 mmol/L — ABNORMAL LOW (ref 22–32)
Calcium: 8.4 mg/dL — ABNORMAL LOW (ref 8.9–10.3)
Chloride: 110 mmol/L (ref 98–111)
Creatinine, Ser: 0.69 mg/dL (ref 0.44–1.00)
GFR, Estimated: >60 mL/min (ref >60)
Glucose, Bld: 83 mg/dL (ref 70–99)
Potassium: 4.1 mmol/L (ref 3.5–5.1)
Sodium: 138 mmol/L (ref 135–145)

## 2021-12-07 LAB — CBC
HCT: 41.6 % (ref 36.0–46.0)
Hemoglobin: 13.5 g/dL (ref 12.0–15.0)
MCH: 30.7 pg (ref 26.0–34.0)
MCHC: 32.5 g/dL (ref 30.0–36.0)
MCV: 94.5 fL (ref 80.0–100.0)
Platelets: 271 10*3/uL (ref 150–400)
RBC: 4.4 MIL/uL (ref 3.87–5.11)
RDW: 13.1 % (ref 11.5–15.5)
WBC: 5.4 10*3/uL (ref 4.0–10.5)
nRBC: 0 % (ref 0.0–0.2)

## 2021-12-07 LAB — MAGNESIUM: Magnesium: 2.2 mg/dL (ref 1.7–2.4)

## 2021-12-07 NOTE — Care Management (Signed)
?  Transition of Care (TOC) Screening Note ? ? ?Patient Details  ?Name: Emma Gross ?Date of Birth: [birth year] ? ? ?Transition of Care (TOC) CM/SW Contact:    ?Caryn Section, RN ?Phone Number: ?12/07/2021, 10:59 AM ? ?As per care team, patient has not discharge needs from [facility] ? ?Transition of Care Department [facility]) has reviewed patient and no TOC needs have been identified at this time. We will continue to monitor patient advancement through interdisciplinary progression rounds. If new patient transition needs arise, please place a TOC consult. ?  ? ? ?

## 2021-12-07 NOTE — Discharge Summary (Signed)
?Physician Discharge Summary ?  ?Patient: Emma Gross MRN: [id number] DOB: 29-Jan-1971  ?Admit date:     12/05/2021  ?Discharge date: 12/07/21  ?Discharge Physician: Alford Highlandichard Waylon Koffler  ? ?PCP: Leanna SatoMiles, Linda M, MD  ? ?Recommendations at discharge:  ? ?Follow-up PCP 5 days ? ?Discharge Diagnoses: ?Principal Problem: ?  Hematochezia ?Active Problems: ?  Hypokalemia ?  Migraine ?  Insomnia ? ? ?Hospital Course: ?The patient was admitted to the hospital on 12/05/2021 and discharged on 12/07/2021.  Patient came in with rectal bleeding.  CT scan was concerning for possible colitis.  The patient had a negative colonoscopy recently.  Stools comprehensive panel was negative so antibiotics were discontinued.  Bleeding had subsided and patient tolerated diet and patient was discharged home. ? ?Assessment and Plan: ?* Hematochezia ?Rectal bleeding.  Could be ischemic colitis.  Discontinue antibiotics and stool comprehensive panel negative.  This is not C. difficile with focal bleeding.  Patient had no further bleeding and tolerated diet discharged home in stable condition.  Hemoglobin upon discharge 13.5. ? ?Hypokalemia ?Potassium supplementation in the normal range. ? ?Migraine ?Try to limit Imitrex.  Continue Topamax ? ?Insomnia ?Takes Ambien at home. ? ? ? ? ?  ? ? ?Consultants: Gastroenterology ?Procedures performed: None ?Disposition: Home ?Diet recommendation:  ?Regular diet ?DISCHARGE MEDICATION: ?Allergies as of 12/07/2021   ? ?   Reactions  ? Sulfa Antibiotics   ? Other reaction(s): Unknown ?Childhood allergy  ? ?  ? ?  ?Medication List  ?  ? ?STOP taking these medications   ? ?phentermine 37.5 MG tablet ?Commonly known as: ADIPEX-P ?  ? ?  ? ?TAKE these medications   ? ?SUMAtriptan 100 MG tablet ?Commonly known as: IMITREX ?Take 50 to 100 mg by mouth at onset of migraine. May repeat in 2 hours. Max of 2 doses per day. ?  ?topiramate 25 MG tablet ?Commonly known as: TOPAMAX ?Take 25 mg by mouth 2 (two) times daily. ?  ?zolpidem  12.5 MG CR tablet ?Commonly known as: AMBIEN CR ?Take 12.5 mg by mouth at bedtime as needed. ?  ? ?  ? ? Follow-up Information   ? ? Leanna SatoMiles, Linda M, MD Follow up in 5 day(s).   ?Specialty: Family Medicine ?Contact information: ?[address] ?[place] [address] ?[phone] ? ? ?  ?  ? ? Norma Fredricksonoledo, Boykin Nearingeodoro K, MD Follow up.   ?Specialty: Gastroenterology ?Why: If symptoms worsen ?Contact information: ?[address] ?[place] [address] ?[phone] ? ? ?  ?  ? ?  ?  ? ?  ? ?Discharge Exam: ?Filed Weights  ? 12/05/21 2034  ?Weight: 90.7 kg  ? ?Physical Exam ?HENT:  ?   Head: Normocephalic.  ?   Mouth/Throat:  ?   Pharynx: No oropharyngeal exudate.  ?Eyes:  ?   General: Lids are normal.  ?   Conjunctiva/sclera: Conjunctivae normal.  ?Cardiovascular:  ?   Rate and Rhythm: Normal rate and regular rhythm.  ?   Heart sounds: Normal heart sounds, S1 normal and S2 normal.  ?Pulmonary:  ?   Breath sounds: No decreased breath sounds, wheezing, rhonchi or rales.  ?Abdominal:  ?   Palpations: Abdomen is soft.  ?   Tenderness: There is no abdominal tenderness.  ?Musculoskeletal:  ?   Right lower leg: No swelling.  ?   Left lower leg: No swelling.  ?Skin: ?   General: Skin is warm.  ?   Findings: No rash.  ?Neurological:  ?  Mental Status: She is alert and oriented to person, place, and time.  ?  ? ?Condition at discharge: stable ? ?The results of significant diagnostics from this hospitalization (including imaging, microbiology, ancillary and laboratory) are listed below for reference.  ? ?Imaging Studies: ?CT ANGIO GI BLEED ? ?Result Date: 12/05/2021 ?CLINICAL DATA:  Lower GI bleed. Rectal bleeding starting yesterday. Multiple episodes of diarrhea yesterday. EXAM: CTA ABDOMEN AND PELVIS WITHOUT AND WITH CONTRAST TECHNIQUE: Multidetector CT imaging of the abdomen and pelvis was performed using the standard protocol during bolus administration of intravenous contrast. Multiplanar reconstructed images and MIPs were  obtained and reviewed to evaluate the vascular anatomy. RADIATION DOSE REDUCTION: This exam was performed according to the departmental dose-optimization program which includes automated exposure control, adjustment of the mA and/or kV according to patient size and/or use of iterative reconstruction technique. CONTRAST:  OMNIPAQUE IOHEXOL 350 MG/ML SOLN COMPARISON:  CT 08/14/2007 FINDINGS: VASCULAR Aorta: Normal caliber aorta without aneurysm, dissection, vasculitis or significant stenosis. Celiac: Patent without evidence of aneurysm, dissection, vasculitis or significant stenosis. SMA: Patent without evidence of aneurysm, dissection, vasculitis or significant stenosis. Renals: Duplicated renal arteries bilaterally. Renal arteries are patent. No aneurysm, dissection, or significant stenosis. IMA: Patent without evidence of aneurysm, dissection, vasculitis or significant stenosis. Inflow: Patent without evidence of aneurysm, dissection, vasculitis or significant stenosis. Proximal Outflow: Bilateral common femoral and visualized portions of the superficial and profunda femoral arteries are patent without evidence of aneurysm, dissection, vasculitis or significant stenosis. Veins: No obvious venous abnormality within the limitations of this arterial phase study. Review of the MIP images confirms the above findings. NON-VASCULAR Lower chest: Mild dependent changes in the lung bases. Hepatobiliary: No focal liver abnormality is seen. No gallstones, gallbladder wall thickening, or biliary dilatation. Pancreas: Unremarkable. No pancreatic ductal dilatation or surrounding inflammatory changes. Spleen: Normal in size without focal abnormality. Adrenals/Urinary Tract: No adrenal gland nodules. Renal nephrograms are symmetrical. Right intrarenal stones are identified, largest measuring about 5 mm diameter. No hydronephrosis or hydroureter. No ureteral stones are seen. Bladder is unremarkable. Stomach/Bowel: Residual  contrast material in the cecum and ascending colon. Stomach, small bowel, and colon are not abnormally distended. Decompression of the colon limits evaluation but there is suggestion of focal area of wall thickening at the mid descending colon with mild pericolonic stranding. This could represent a focal area of inflammation such as diverticulitis or it could indicate a focal colonic lesion. Consider follow-up endoscopy. No proximal obstruction. No intraluminal contrast extravasation is demonstrated. No hemorrhagic focus is identified. Lymphatic: No significant lymphadenopathy. Reproductive: Uterus and bilateral adnexa are unremarkable. An intrauterine device is in place. Other: No free air or free fluid in the abdomen. Abdominal wall musculature appears intact. Musculoskeletal: No acute or significant osseous findings. IMPRESSION: VASCULAR No significant vascular abnormalities. No evidence of central arterial aneurysm or stenosis. NON-VASCULAR 1. No focal area of active gastrointestinal bleeding is demonstrated. 2. Focal area of wall thickening with mild pericolonic stranding is suggested in the mid descending colon. This may indicate an area of inflammation such as diverticulitis or may indicate an underlying colon neoplasm. Consider endoscopy for further evaluation. No abscess. 3. Nonobstructing intrarenal stones on the right. Electronically Signed   By: Burman Nieves M.D.   On: 12/05/2021 22:06   ? ?Microbiology: ?Results for orders placed or performed during the hospital encounter of 12/05/21  ?Gastrointestinal Panel by PCR , Stool     Status: None  ? Collection Time: 12/06/21  9:30 AM  ? Specimen:  Stool  ?Result Value Ref Range Status  ? Campylobacter species NOT DETECTED NOT DETECTED Final  ? Plesimonas shigelloides NOT DETECTED NOT DETECTED Final  ? Salmonella species NOT DETECTED NOT DETECTED Final  ? Yersinia enterocolitica NOT DETECTED NOT DETECTED Final  ? Vibrio species NOT DETECTED NOT DETECTED  Final  ? Vibrio cholerae NOT DETECTED NOT DETECTED Final  ? Enteroaggregative E coli (EAEC) NOT DETECTED NOT DETECTED Final  ? Enteropathogenic E coli (EPEC) NOT DETECTED NOT DETECTED Final  ? Enterotoxigenic

## 2022-08-16 IMAGING — CT CTA GI BLEED
2 of 16 series · 10 of 46 positions shown, 15 images · IV contrast (agent unspecified)
Comparison: CT 08/14/2007

CLINICAL DATA: Lower GI bleed. Rectal bleeding starting yesterday.
Multiple episodes of diarrhea yesterday.

EXAM:
CTA ABDOMEN AND PELVIS WITHOUT AND WITH CONTRAST
TECHNIQUE: Multidetector CT imaging of the abdomen and pelvis was performed
using the standard protocol during bolus administration of
intravenous contrast. Multiplanar reconstructed images and MIPs were
obtained and reviewed to evaluate the vascular anatomy.

[Series 12: cor · coronal · 0.75mm/px · 1 of 149 slices shown, 2 images]
[im 75/149  soft-tissue]
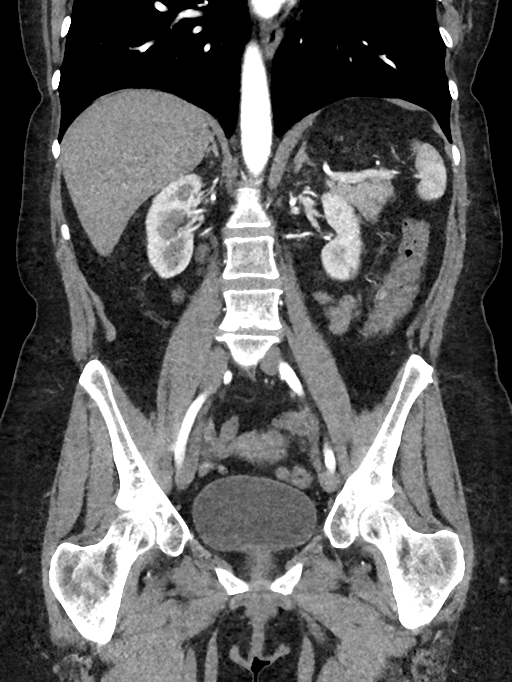
[im 75/149  bone]
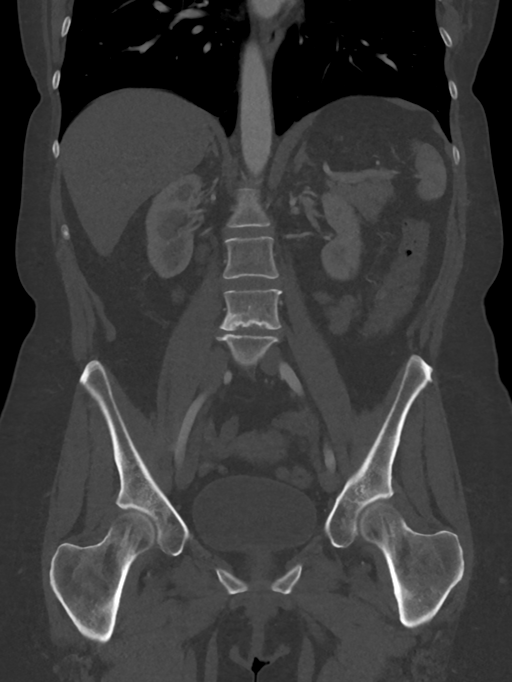

[Series 17: venous thins · axial · portal-venous · 0.80mm/px · z∈[-1149,-733]mm · 9 of 1271 slices shown, 13 images]
[im 116/1271  soft-tissue]
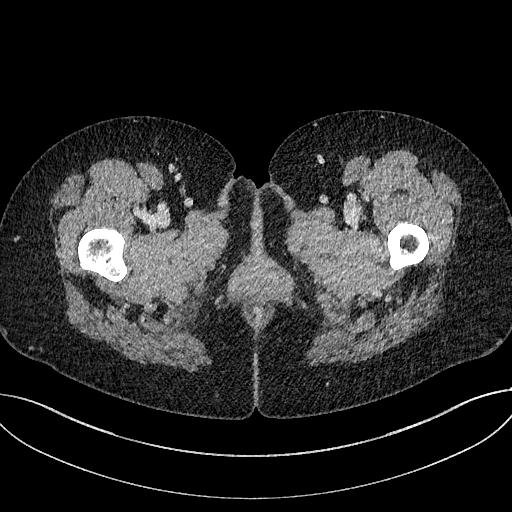
[im 116/1271  bone]
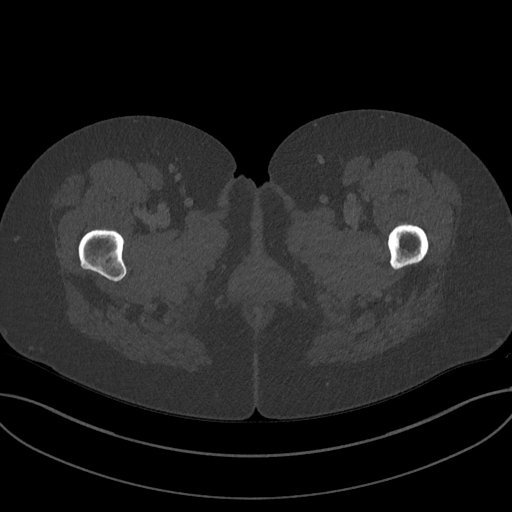
[im 231/1271  soft-tissue]
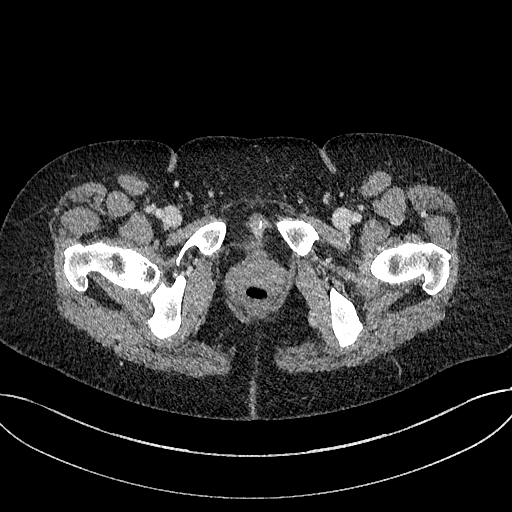
[im 462/1271  soft-tissue]
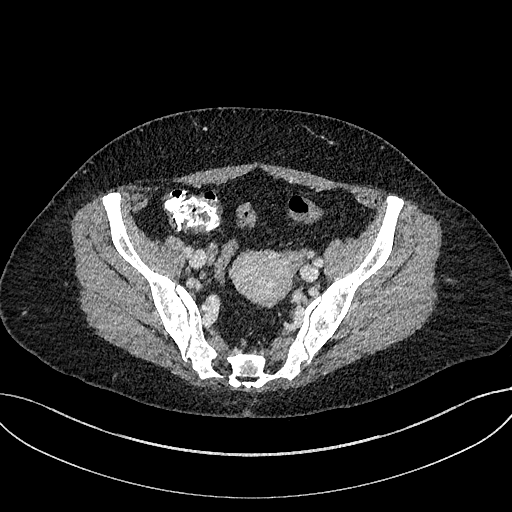
[im 578/1271  soft-tissue]
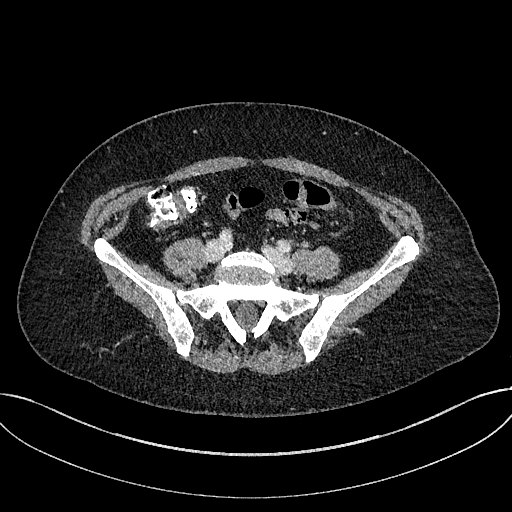
[im 693/1271  soft-tissue]
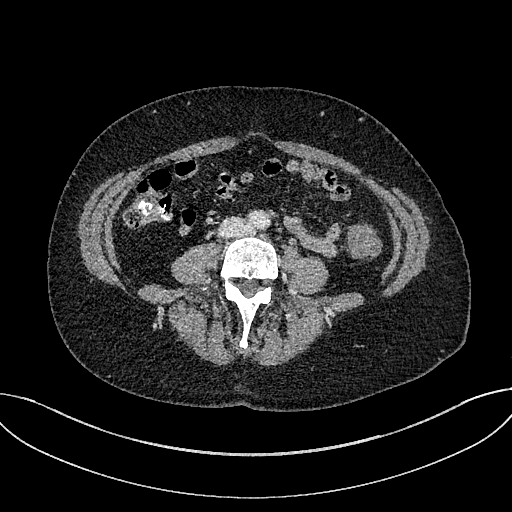
[im 809/1271  soft-tissue]
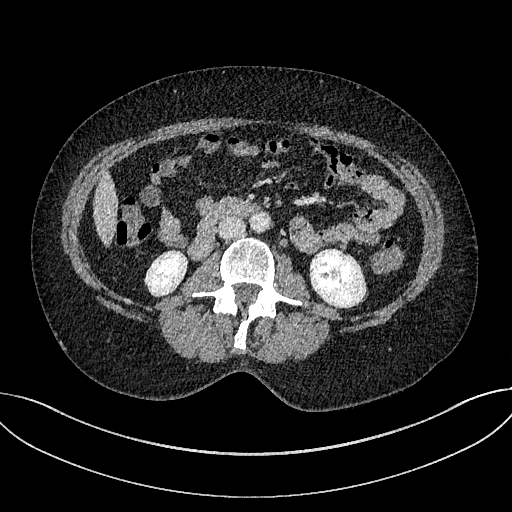
[im 809/1271  lung]
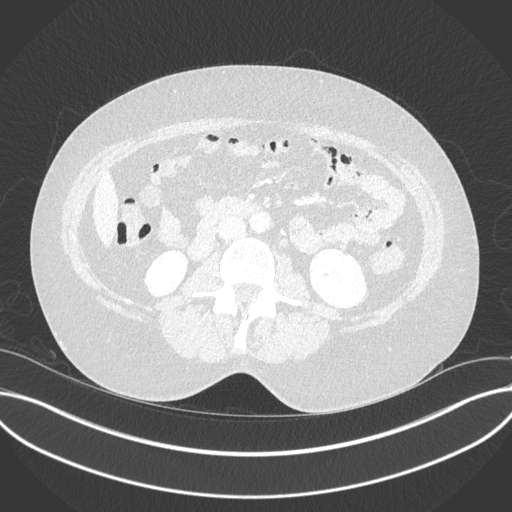
[im 924/1271  lung]
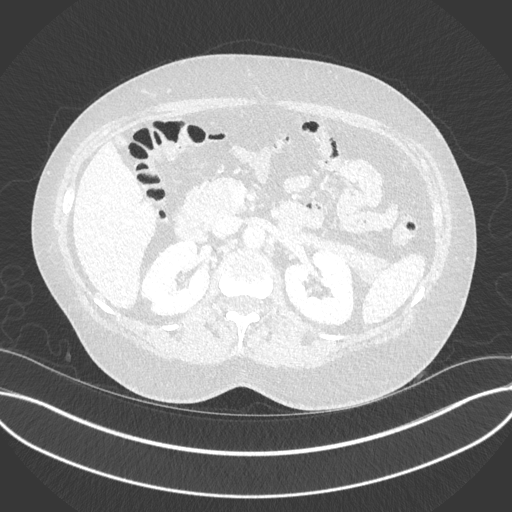
[im 1040/1271  soft-tissue]
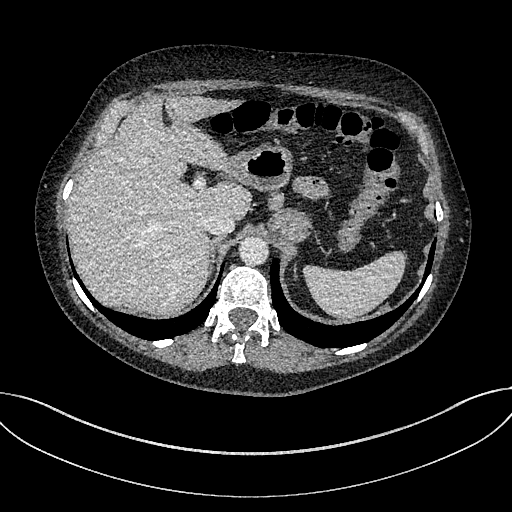
[im 1040/1271  lung]
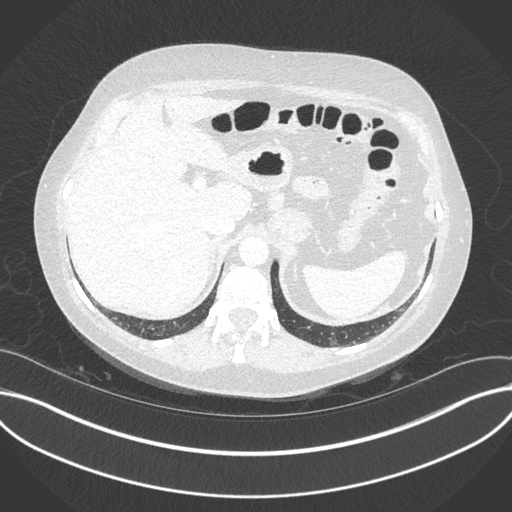
[im 1155/1271  soft-tissue]
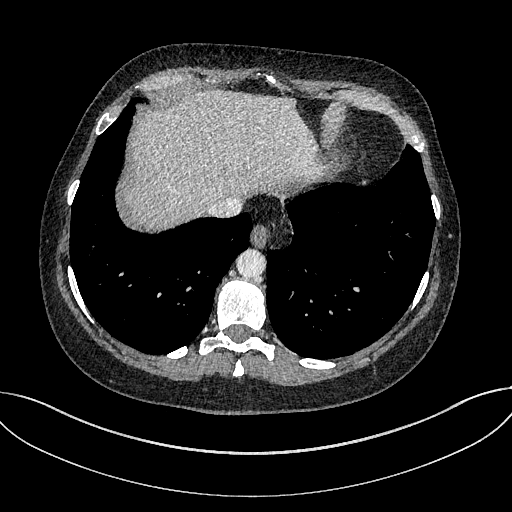
[im 1155/1271  lung]
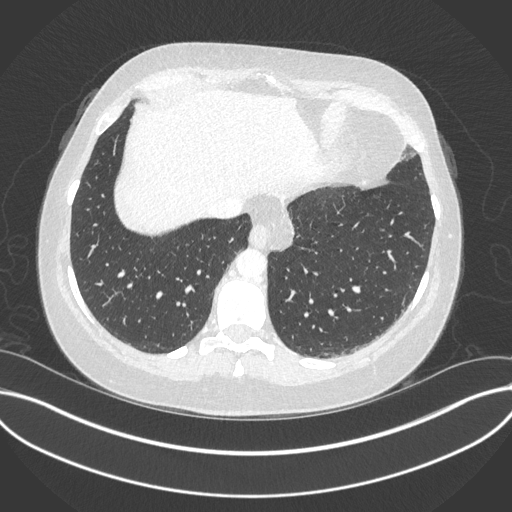

[10 of 46 positions shown; findings below may reference images not displayed]

RADIATION DOSE REDUCTION: This exam was performed according to the
departmental dose-optimization program which includes automated
exposure control, adjustment of the mA and/or kV according to
patient size and/or use of iterative reconstruction technique.

CONTRAST:  100mL OMNIPAQUE IOHEXOL 350 MG/ML SOLN
FINDINGS: VASCULAR

Aorta: Normal caliber aorta without aneurysm, dissection, vasculitis
or significant stenosis.

Celiac: Patent without evidence of aneurysm, dissection, vasculitis
or significant stenosis.

SMA: Patent without evidence of aneurysm, dissection, vasculitis or
significant stenosis.

Renals: Duplicated renal arteries bilaterally. Renal arteries are
patent. No aneurysm, dissection, or significant stenosis.

IMA: Patent without evidence of aneurysm, dissection, vasculitis or
significant stenosis.

Inflow: Patent without evidence of aneurysm, dissection, vasculitis
or significant stenosis.

Proximal Outflow: Bilateral common femoral and visualized portions
of the superficial and profunda femoral arteries are patent without
evidence of aneurysm, dissection, vasculitis or significant
stenosis.

Veins: No obvious venous abnormality within the limitations of this
arterial phase study.

Review of the MIP images confirms the above findings.

NON-VASCULAR

Lower chest: Mild dependent changes in the lung bases.

Hepatobiliary: No focal liver abnormality is seen. No gallstones,
gallbladder wall thickening, or biliary dilatation.

Pancreas: Unremarkable. No pancreatic ductal dilatation or
surrounding inflammatory changes.

Spleen: Normal in size without focal abnormality.

Adrenals/Urinary Tract: No adrenal gland nodules. Renal nephrograms
are symmetrical. Right intrarenal stones are identified, largest
measuring about 5 mm diameter. No hydronephrosis or hydroureter. No
ureteral stones are seen. Bladder is unremarkable.

Stomach/Bowel: Residual contrast material in the cecum and ascending
colon. Stomach, small bowel, and colon are not abnormally distended.
Decompression of the colon limits evaluation but there is suggestion
of focal area of wall thickening at the mid descending colon with
mild pericolonic stranding. This could represent a focal area of
inflammation such as diverticulitis or it could indicate a focal
colonic lesion. Consider follow-up endoscopy. No proximal
obstruction. No intraluminal contrast extravasation is demonstrated.
No hemorrhagic focus is identified.

Lymphatic: No significant lymphadenopathy.

Reproductive: Uterus and bilateral adnexa are unremarkable. An
intrauterine device is in place.

Other: No free air or free fluid in the abdomen. Abdominal wall
musculature appears intact.

Musculoskeletal: No acute or significant osseous findings.
IMPRESSION: VASCULAR

No significant vascular abnormalities. No evidence of central
arterial aneurysm or stenosis.

NON-VASCULAR

1. No focal area of active gastrointestinal bleeding is
demonstrated.
2. Focal area of wall thickening with mild pericolonic stranding is
suggested in the mid descending colon. This may indicate an area of
inflammation such as diverticulitis or may indicate an underlying
colon neoplasm. Consider endoscopy for further evaluation. No
abscess.
3. Nonobstructing intrarenal stones on the right.

## 2023-09-13 NOTE — Progress Notes (Signed)
 Patient ID: Emma Gross, female   DOB: March 25, 1971, 53 y.o.   MRN: [id number]  Chief Complaint: Bellybutton pain  History of Present Illness Emma Gross is a 53 y.o. female with burning pain of navel area, tender over 1 year.  No change in bowel activity, no other abdominal pain, no associated nausea vomiting, nor to fever or chills.  No prior abdominal surgeries or hernia repairs..  Past Medical History Past Medical History:  Diagnosis Date   Acute hemorrhagic colitis 12/05/2021      Past Surgical History:  Procedure Laterality Date   BREAST ENHANCEMENT SURGERY     TONSILLECTOMY AND ADENOIDECTOMY      Allergies  Allergen Reactions   Sulfa Antibiotics     Other reaction(s): Unknown Childhood allergy    Current Outpatient Medications  Medication Sig Dispense Refill   levonorgestrel (MIRENA) 20 MCG/DAY IUD 1 each by Intrauterine route once.     phentermine (ADIPEX-P) 37.5 MG tablet Take 37.5 mg by mouth daily before breakfast.     SUMAtriptan (IMITREX) 100 MG tablet Take 50 to 100 mg by mouth at onset of migraine. May repeat in 2 hours. Max of 2 doses per day.     zolpidem (AMBIEN CR) 12.5 MG CR tablet Take 12.5 mg by mouth at bedtime as needed.     No current facility-administered medications for this visit.    Family History No family history on file.    Social History Social History   Tobacco Use   Smoking status: Never    Passive exposure: Never   Smokeless tobacco: Never  Vaping Use   Vaping status: Never Used  Substance Use Topics   Alcohol use: Yes   Drug use: Never        Review of Systems  Constitutional: Negative.   HENT: Negative.    Eyes: Negative.   Respiratory: Negative.    Cardiovascular: Negative.   Gastrointestinal:  Positive for heartburn.  Genitourinary: Negative.   Skin: Negative.   Neurological: Negative.   Psychiatric/Behavioral: Negative.       Physical Exam Blood pressure (!) 154/91, pulse 88, temperature 98 F (36.7  C), height 5\' 9"  (1.753 m), SpO2 100%.   CONSTITUTIONAL: Well developed, and nourished, appropriately responsive and aware without distress.   EYES: Sclera non-icteric.   EARS, NOSE, MOUTH AND THROAT:  The oropharynx is clear. Oral mucosa is pink and moist.    Hearing is intact to voice.  NECK: Trachea is midline, and there is no jugular venous distension.  LYMPH NODES:  Lymph nodes in the neck are not appreciated. RESPIRATORY:  Lungs are clear, and breath sounds are equal bilaterally.  Normal respiratory effort without pathologic use of accessory muscles. CARDIOVASCULAR: Heart is regular in rate and rhythm.   Well perfused.  GI: The abdomen is notable for no evidence defect/visual abnormality of the umbilical area.  Upon palpation there is tenderness to the deep fascial plane.  There may be a subcentimeter defect at that point.  There was nothing reducible, though it was tender on deep palpation to this area.  Otherwise soft, nontender, and nondistended. There were no palpable masses.  I did not appreciate hepatosplenomegaly. MUSCULOSKELETAL:  Symmetrical muscle tone appreciated in all four extremities.    SKIN: Skin turgor is normal. No pathologic skin lesions appreciated.  NEUROLOGIC:  Motor and sensation appear grossly normal.  Cranial nerves are grossly without defect. PSYCH:  Alert and oriented to person, place and time. Affect is appropriate for situation.  Data Reviewed I have personally reviewed what is currently available of the patient's imaging, recent labs and medical records.   Labs:     Latest Ref Rng & Units 12/07/2021    6:04 AM 12/06/2021   11:28 AM 12/06/2021    5:04 AM  CBC  WBC 4.0 - 10.5 K/uL 5.4   6.1   Hemoglobin 12.0 - 15.0 g/dL 16.1  09.6  04.5   Hematocrit 36.0 - 46.0 % 41.6  40.4  41.8   Platelets 150 - 400 K/uL 271   315       Latest Ref Rng & Units 12/07/2021    6:04 AM 12/06/2021    5:04 AM 12/05/2021    8:38 PM  CMP  Glucose 70 - 99 mg/dL 83  97  409   BUN 6  - 20 mg/dL 7  10  11    Creatinine 0.44 - 1.00 mg/dL 8.11  9.14  7.82   Sodium 135 - 145 mmol/L 138  138  136   Potassium 3.5 - 5.1 mmol/L 4.1  3.2  3.0   Chloride 98 - 111 mmol/L 110  107  105   CO2 22 - 32 mmol/L 20  24  23    Calcium 8.9 - 10.3 mg/dL 8.4  8.4  9.0   Total Protein 6.5 - 8.1 g/dL   8.5   Total Bilirubin 0.3 - 1.2 mg/dL   1.1   Alkaline Phos 38 - 126 U/L   52   AST 15 - 41 U/L   21   ALT 0 - 44 U/L   11     Imaging: Radiological images reviewed:   Within last 24 hrs: No results found.  Assessment    Small symptomatic umbilical defect.  Patient Active Problem List   Diagnosis Date Noted   Hypokalemia 12/06/2021   Hematochezia 12/06/2021   Migraine 12/05/2021   Insomnia 12/05/2021    Plan    Open repair of < 3 cm umbilical hernia defect, initial, reducible.   I discussed possibility of incarceration, strangulation, enlargement in size over time, and the need for emergency surgery in the face of these.  Also reviewed the techniques of reduction should incarceration occur, and when unsuccessful to present to the ED.  Also discussed that surgery risks include recurrence which can be up to 30% in the case of complex hernias, use of prosthetic materials (mesh) and the increased risk of infection and the possible need for re-operation and removal of mesh, possibility of post-op SBO or ileus, and the risks of general anesthetic including heart attack, stroke, sudden death or some reaction to anesthetic medications. The patient, and those present, appear to understand the risks, any and all questions were answered to the patient's satisfaction.  No guarantees were ever expressed or implied.  Face-to-face time spent with the patient and accompanying care providers(if present) was 30 minutes, with more than 50% of the time spent counseling, educating, and coordinating care of the patient.    These notes generated with voice recognition software. I apologize for typographical  errors.  Campbell Lerner M.D., FACS 09/14/2023, 11:40 AM

## 2023-09-13 NOTE — H&P (View-Only) (Signed)
Patient ID: Emma Gross, female   DOB: March 25, 1971, 53 y.o.   MRN: [id number]  Chief Complaint: Bellybutton pain  History of Present Illness Emma Gross is a 53 y.o. female with burning pain of navel area, tender over 1 year.  No change in bowel activity, no other abdominal pain, no associated nausea vomiting, nor to fever or chills.  No prior abdominal surgeries or hernia repairs..  Past Medical History Past Medical History:  Diagnosis Date   Acute hemorrhagic colitis 12/05/2021      Past Surgical History:  Procedure Laterality Date   BREAST ENHANCEMENT SURGERY     TONSILLECTOMY AND ADENOIDECTOMY      Allergies  Allergen Reactions   Sulfa Antibiotics     Other reaction(s): Unknown Childhood allergy    Current Outpatient Medications  Medication Sig Dispense Refill   levonorgestrel (MIRENA) 20 MCG/DAY IUD 1 each by Intrauterine route once.     phentermine (ADIPEX-P) 37.5 MG tablet Take 37.5 mg by mouth daily before breakfast.     SUMAtriptan (IMITREX) 100 MG tablet Take 50 to 100 mg by mouth at onset of migraine. May repeat in 2 hours. Max of 2 doses per day.     zolpidem (AMBIEN CR) 12.5 MG CR tablet Take 12.5 mg by mouth at bedtime as needed.     No current facility-administered medications for this visit.    Family History No family history on file.    Social History Social History   Tobacco Use   Smoking status: Never    Passive exposure: Never   Smokeless tobacco: Never  Vaping Use   Vaping status: Never Used  Substance Use Topics   Alcohol use: Yes   Drug use: Never        Review of Systems  Constitutional: Negative.   HENT: Negative.    Eyes: Negative.   Respiratory: Negative.    Cardiovascular: Negative.   Gastrointestinal:  Positive for heartburn.  Genitourinary: Negative.   Skin: Negative.   Neurological: Negative.   Psychiatric/Behavioral: Negative.       Physical Exam Blood pressure (!) 154/91, pulse 88, temperature 98 F (36.7  C), height 5\' 9"  (1.753 m), SpO2 100%.   CONSTITUTIONAL: Well developed, and nourished, appropriately responsive and aware without distress.   EYES: Sclera non-icteric.   EARS, NOSE, MOUTH AND THROAT:  The oropharynx is clear. Oral mucosa is pink and moist.    Hearing is intact to voice.  NECK: Trachea is midline, and there is no jugular venous distension.  LYMPH NODES:  Lymph nodes in the neck are not appreciated. RESPIRATORY:  Lungs are clear, and breath sounds are equal bilaterally.  Normal respiratory effort without pathologic use of accessory muscles. CARDIOVASCULAR: Heart is regular in rate and rhythm.   Well perfused.  GI: The abdomen is notable for no evidence defect/visual abnormality of the umbilical area.  Upon palpation there is tenderness to the deep fascial plane.  There may be a subcentimeter defect at that point.  There was nothing reducible, though it was tender on deep palpation to this area.  Otherwise soft, nontender, and nondistended. There were no palpable masses.  I did not appreciate hepatosplenomegaly. MUSCULOSKELETAL:  Symmetrical muscle tone appreciated in all four extremities.    SKIN: Skin turgor is normal. No pathologic skin lesions appreciated.  NEUROLOGIC:  Motor and sensation appear grossly normal.  Cranial nerves are grossly without defect. PSYCH:  Alert and oriented to person, place and time. Affect is appropriate for situation.  Data Reviewed I have personally reviewed what is currently available of the patient's imaging, recent labs and medical records.   Labs:     Latest Ref Rng & Units 12/07/2021    6:04 AM 12/06/2021   11:28 AM 12/06/2021    5:04 AM  CBC  WBC 4.0 - 10.5 K/uL 5.4   6.1   Hemoglobin 12.0 - 15.0 g/dL 16.1  09.6  04.5   Hematocrit 36.0 - 46.0 % 41.6  40.4  41.8   Platelets 150 - 400 K/uL 271   315       Latest Ref Rng & Units 12/07/2021    6:04 AM 12/06/2021    5:04 AM 12/05/2021    8:38 PM  CMP  Glucose 70 - 99 mg/dL 83  97  409   BUN 6  - 20 mg/dL 7  10  11    Creatinine 0.44 - 1.00 mg/dL 8.11  9.14  7.82   Sodium 135 - 145 mmol/L 138  138  136   Potassium 3.5 - 5.1 mmol/L 4.1  3.2  3.0   Chloride 98 - 111 mmol/L 110  107  105   CO2 22 - 32 mmol/L 20  24  23    Calcium 8.9 - 10.3 mg/dL 8.4  8.4  9.0   Total Protein 6.5 - 8.1 g/dL   8.5   Total Bilirubin 0.3 - 1.2 mg/dL   1.1   Alkaline Phos 38 - 126 U/L   52   AST 15 - 41 U/L   21   ALT 0 - 44 U/L   11     Imaging: Radiological images reviewed:   Within last 24 hrs: No results found.  Assessment    Small symptomatic umbilical defect.  Patient Active Problem List   Diagnosis Date Noted   Hypokalemia 12/06/2021   Hematochezia 12/06/2021   Migraine 12/05/2021   Insomnia 12/05/2021    Plan    Open repair of < 3 cm umbilical hernia defect, initial, reducible.   I discussed possibility of incarceration, strangulation, enlargement in size over time, and the need for emergency surgery in the face of these.  Also reviewed the techniques of reduction should incarceration occur, and when unsuccessful to present to the ED.  Also discussed that surgery risks include recurrence which can be up to 30% in the case of complex hernias, use of prosthetic materials (mesh) and the increased risk of infection and the possible need for re-operation and removal of mesh, possibility of post-op SBO or ileus, and the risks of general anesthetic including heart attack, stroke, sudden death or some reaction to anesthetic medications. The patient, and those present, appear to understand the risks, any and all questions were answered to the patient's satisfaction.  No guarantees were ever expressed or implied.  Face-to-face time spent with the patient and accompanying care providers(if present) was 30 minutes, with more than 50% of the time spent counseling, educating, and coordinating care of the patient.    These notes generated with voice recognition software. I apologize for typographical  errors.  Campbell Lerner M.D., FACS 09/14/2023, 11:40 AM

## 2023-09-14 ENCOUNTER — Encounter: Payer: Self-pay | Admitting: Surgery

## 2023-09-14 ENCOUNTER — Telehealth: Payer: Self-pay | Admitting: Surgery

## 2023-09-14 ENCOUNTER — Ambulatory Visit (INDEPENDENT_AMBULATORY_CARE_PROVIDER_SITE_OTHER): Payer: Managed Care, Other (non HMO) | Admitting: Surgery

## 2023-09-14 VITALS — BP 154/91 | HR 88 | Temp 98.0°F | Ht 69.0 in

## 2023-09-14 DIAGNOSIS — K429 Umbilical hernia without obstruction or gangrene: Secondary | ICD-10-CM | POA: Diagnosis not present

## 2023-09-14 NOTE — Patient Instructions (Signed)
You have requested for your Umbilical Hernia be repaired. This will be scheduled with Dr. Rodenberg at [facility].   Please see your (blue)pre-care sheet for information. Our surgery scheduler will call you to verify surgery date and to go over information.   You will need to arrange to be off work for 1-2 weeks but will have to have a lifting restriction of no more than 15 lbs for 6 weeks following your surgery. If you have FMLA or disability paperwork that needs filled out you may drop this off at our office or this can be faxed to [phone].     Umbilical Hernia, Adult A hernia is a bulge of tissue that pushes through an opening between muscles. An umbilical hernia happens in the abdomen, near the belly button (umbilicus). The hernia may contain tissues from the small intestine, large intestine, or fatty tissue covering the intestines (omentum). Umbilical hernias in adults tend to get worse over time, and they require surgical treatment. There are several types of umbilical hernias. You may have: A hernia located just above or below the umbilicus (indirect hernia). This is the most common type of umbilical hernia in adults. A hernia that forms through an opening formed by the umbilicus (direct hernia). A hernia that comes and goes (reducible hernia). A reducible hernia may be visible only when you strain, lift something heavy, or cough. This type of hernia can be pushed back into the abdomen (reduced). A hernia that traps abdominal tissue inside the hernia (incarcerated hernia). This type of hernia cannot be reduced. A hernia that cuts off blood flow to the tissues inside the hernia (strangulated hernia). The tissues can start to die if this happens. This type of hernia requires emergency treatment.  What are the causes? An umbilical hernia happens when tissue inside the abdomen presses on a weak area of the abdominal muscles. What increases the risk? You may have  a greater risk of this condition if you: Are obese. Have had several pregnancies. Have a buildup of fluid inside your abdomen (ascites). Have had surgery that weakens the abdominal muscles.  What are the signs or symptoms? The main symptom of this condition is a painless bulge at or near the belly button. A reducible hernia may be visible only when you strain, lift something heavy, or cough. Other symptoms may include: Dull pain. A feeling of pressure.  Symptoms of a strangulated hernia may include: Pain that gets increasingly worse. Nausea and vomiting. Pain when pressing on the hernia. Skin over the hernia becoming red or purple. Constipation. Blood in the stool.  How is this diagnosed? This condition may be diagnosed based on: A physical exam. You may be asked to cough or strain while standing. These actions increase the pressure inside your abdomen and force the hernia through the opening in your muscles. Your health care provider may try to reduce the hernia by pressing on it. Your symptoms and medical history.  How is this treated? Surgery is the only treatment for an umbilical hernia. Surgery for a strangulated hernia is done as soon as possible. If you have a small hernia that is not incarcerated, you may need to lose weight before having surgery. Follow these instructions at home: Lose weight, if told by your health care provider. Do not try to push the hernia back in. Watch your hernia for any changes in color or size. Tell your health care provider if any changes occur. You may need to avoid activities   that increase pressure on your hernia. Do not lift anything that is heavier than 10 lb (4.5 kg) until your health care provider says that this is safe. Take over-the-counter and prescription medicines only as told by your health care provider. Keep all follow-up visits as told by your health care provider. This is important. Contact a health care provider if: Your hernia  gets larger. Your hernia becomes painful. Get help right away if: You develop sudden, severe pain near the area of your hernia. You have pain as well as nausea or vomiting. You have pain and the skin over your hernia changes color. You develop a fever. This information is not intended to replace advice given to you by your health care provider. Make sure you discuss any questions you have with your health care provider. Document Released: 01/22/2016 Document Revised: 04/24/2016 Document Reviewed: 01/22/2016 Elsevier Interactive Patient Education  2018 [employer]    

## 2023-09-14 NOTE — Telephone Encounter (Signed)
 Patient has been advised of Pre-Admission date/time, and Surgery date at [facility].  Surgery Date: 09/27/23 Preadmission Testing Date: 09/20/23 (phone 8a-1p)  Patient has been made aware to call [phone], between 1-3:00pm the day before surgery, to find out what time to arrive for surgery.

## 2023-09-16 ENCOUNTER — Ambulatory Visit: Payer: Self-pay | Admitting: Surgery

## 2023-09-16 DIAGNOSIS — K429 Umbilical hernia without obstruction or gangrene: Secondary | ICD-10-CM | POA: Insufficient documentation

## 2023-09-20 ENCOUNTER — Encounter
Admission: RE | Admit: 2023-09-20 | Discharge: 2023-09-20 | Disposition: A | Discharge status: Home / Self Care | Payer: BLUE CROSS/BLUE SHIELD | Source: Ambulatory Visit | Attending: Surgery | Admitting: Surgery

## 2023-09-20 HISTORY — DX: Umbilical hernia without obstruction or gangrene: K42.9

## 2023-09-20 HISTORY — DX: Gastro-esophageal reflux disease without esophagitis: K21.9

## 2023-09-20 HISTORY — DX: Personal history of other diseases of the nervous system and sense organs: Z86.69

## 2023-09-20 NOTE — Patient Instructions (Signed)
 Your procedure is scheduled on:09-27-23 Wednesday Report to the Registration Desk on the 1st floor of the [facility].Then proceed to the 2nd floor Surgery Desk To find out your arrival time, please call [phone] between 1PM - 3PM on:09-26-23 Tuesday If your arrival time is 6:00 am, do not arrive before that time as the [facility] entrance doors do not open until 6:00 am.  REMEMBER: Instructions that are not followed completely may result in serious medical risk, up to and including death; or upon the discretion of your surgeon and anesthesiologist your surgery may need to be rescheduled.  Do not eat food OR drink any liquids after midnight the night before surgery.  No gum chewing or hard candies.  One week prior to surgery:Stop NOW (09-20-23) Stop Anti-inflammatories (NSAIDS) such as Advil , Aleve, Ibuprofen , Motrin , Naproxen, Naprosyn and Aspirin based products such as Excedrin, Goody's Powder, BC Powder. Stop ANY OVER THE COUNTER supplements until after surgery (Multivitamin)  You may however, continue to take Tylenol  if needed for pain up until the day of surgery.  Stop phentermine (ADIPEX-P) 7 days prior to surgery-Stop NOW (09-20-23)  Continue taking all of your other prescription medications up until the day of surgery.  Do NOT take any medication the day of surgery  No Alcohol for 24 hours before or after surgery.  No Smoking including e-cigarettes for 24 hours before surgery.  No chewable tobacco products for at least 6 hours before surgery.  No nicotine patches on the day of surgery.  Do not use any "recreational" drugs for at least a week (preferably 2 weeks) before your surgery.  Please be advised that the combination of cocaine and anesthesia may have negative outcomes, up to and including death. If you test positive for cocaine, your surgery will be cancelled.  On the morning of surgery brush your teeth with toothpaste and water, you may rinse your mouth with  mouthwash if you wish. Do not swallow any toothpaste or mouthwash.  Use CHG Soap as directed on instruction sheet.  Do not wear jewelry, make-up, hairpins, clips or nail polish.  For welded (permanent) jewelry: bracelets, anklets, waist bands, etc.  Please have this removed prior to surgery.  If it is not removed, there is a chance that hospital personnel will need to cut it off on the day of surgery.  Do not wear lotions, powders, or perfumes.   Do not shave body hair from the neck down 48 hours before surgery.  Contact lenses, hearing aids and dentures may not be worn into surgery.  Do not bring valuables to the hospital. [facility] is not responsible for any missing/lost belongings or valuables.   Notify your doctor if there is any change in your medical condition (cold, fever, infection).  Wear comfortable clothing (specific to your surgery type) to the hospital.  After surgery, you can help prevent lung complications by doing breathing exercises.  Take deep breaths and cough every 1-2 hours. Your doctor may order a device called an Incentive Spirometer to help you take deep breaths. When coughing or sneezing, hold a pillow firmly against your incision with both hands. This is called "splinting." Doing this helps protect your incision. It also decreases belly discomfort.  If you are being admitted to the hospital overnight, leave your suitcase in the car. After surgery it may be brought to your room.  In case of increased patient census, it may be necessary for you, the patient, to continue your postoperative care in the Same  Day Surgery department.  If you are being discharged the day of surgery, you will not be allowed to drive home. You will need a responsible individual to drive you home and stay with you for 24 hours after surgery.   If you are taking public transportation, you will need to have a responsible individual with you.  Please call the Pre-admissions Testing  Dept. at [phone] if you have any questions about these instructions.  Surgery Visitation Policy:  Patients having surgery or a procedure may have two visitors.  Children under the age of 38 must have an adult with them who is not the patient.  Temporary Visitor Restrictions Due to increasing cases of flu, RSV and COVID-19: Children ages 48 and under will not be able to visit patients in [facility] hospitals under most circumstances.     Preparing for Surgery with CHLORHEXIDINE  GLUCONATE (CHG) Soap  Chlorhexidine  Gluconate (CHG) Soap  o An antiseptic cleaner that kills germs and bonds with the skin to continue killing germs even after washing  o Used for showering the night before surgery and morning of surgery  Before surgery, you can play an important role by reducing the number of germs on your skin.  CHG (Chlorhexidine  gluconate) soap is an antiseptic cleanser which kills germs and bonds with the skin to continue killing germs even after washing.  Please do not use if you have an allergy to CHG or antibacterial soaps. If your skin becomes reddened/irritated stop using the CHG.  1. Shower the NIGHT BEFORE SURGERY and the MORNING OF SURGERY with CHG soap.  2. If you choose to wash your hair, wash your hair first as usual with your normal shampoo.  3. After shampooing, rinse your hair and body thoroughly to remove the shampoo.  4. Use CHG as you would any other liquid soap. You can apply CHG directly to the skin and wash gently with a scrungie or a clean washcloth.  5. Apply the CHG soap to your body only from the neck down. Do not use on open wounds or open sores. Avoid contact with your eyes, ears, mouth, and genitals (private parts). Wash face and genitals (private parts) with your normal soap.  6. Wash thoroughly, paying special attention to the area where your surgery will be performed.  7. Thoroughly rinse your body with warm water.  8. Do not shower/wash with  your normal soap after using and rinsing off the CHG soap.  9. Pat yourself dry with a clean towel.  10. Wear clean pajamas to bed the night before surgery.  12. Place clean sheets on your bed the night of your first shower and do not sleep with pets.  13. Shower again with the CHG soap on the day of surgery prior to arriving at the hospital.  14. Do not apply any deodorants/lotions/powders.  15. Please wear clean clothes to the hospital.

## 2023-09-22 ENCOUNTER — Encounter
Admission: RE | Admit: 2023-09-22 | Discharge: 2023-09-22 | Disposition: A | Discharge status: Home / Self Care | Payer: Managed Care, Other (non HMO) | Source: Ambulatory Visit | Attending: Surgery | Admitting: Surgery

## 2023-09-22 DIAGNOSIS — Z01812 Encounter for preprocedural laboratory examination: Secondary | ICD-10-CM | POA: Diagnosis present

## 2023-09-22 DIAGNOSIS — K429 Umbilical hernia without obstruction or gangrene: Secondary | ICD-10-CM | POA: Insufficient documentation

## 2023-09-22 LAB — COMPREHENSIVE METABOLIC PANEL
ALT: 38 U/L (ref 0–44)
AST: 33 U/L (ref 15–41)
Albumin: 4.3 g/dL (ref 3.5–5.0)
Alkaline Phosphatase: 116 U/L (ref 38–126)
Anion gap: 10 (ref 5–15)
BUN: 14 mg/dL (ref 6–20)
CO2: 26 mmol/L (ref 22–32)
Calcium: 9.4 mg/dL (ref 8.9–10.3)
Chloride: 101 mmol/L (ref 98–111)
Creatinine, Ser: 0.83 mg/dL (ref 0.44–1.00)
GFR, Estimated: >60 mL/min (ref >60)
Glucose, Bld: 89 mg/dL (ref 70–99)
Potassium: 3.6 mmol/L (ref 3.5–5.1)
Sodium: 137 mmol/L (ref 135–145)
Total Bilirubin: 0.8 mg/dL (ref 0.0–1.2)
Total Protein: 8.1 g/dL (ref 6.5–8.1)

## 2023-09-22 LAB — CBC WITH DIFFERENTIAL/PLATELET
Abs Immature Granulocytes: 0.01 10*3/uL (ref 0.00–0.07)
Basophils Absolute: 0 10*3/uL (ref 0.0–0.1)
Basophils Relative: 1 %
Eosinophils Absolute: 0 10*3/uL (ref 0.0–0.5)
Eosinophils Relative: 0 %
HCT: 44.2 % (ref 36.0–46.0)
Hemoglobin: 15 g/dL (ref 12.0–15.0)
Immature Granulocytes: 0 %
Lymphocytes Relative: 36 %
Lymphs Abs: 1.9 10*3/uL (ref 0.7–4.0)
MCH: 31.3 pg (ref 26.0–34.0)
MCHC: 33.9 g/dL (ref 30.0–36.0)
MCV: 92.3 fL (ref 80.0–100.0)
Monocytes Absolute: 0.7 10*3/uL (ref 0.1–1.0)
Monocytes Relative: 12 %
Neutro Abs: 2.8 10*3/uL (ref 1.7–7.7)
Neutrophils Relative %: 51 %
Platelets: 301 10*3/uL (ref 150–400)
RBC: 4.79 MIL/uL (ref 3.87–5.11)
RDW: 12.2 % (ref 11.5–15.5)
WBC: 5.4 10*3/uL (ref 4.0–10.5)
nRBC: 0 % (ref 0.0–0.2)

## 2023-09-27 ENCOUNTER — Ambulatory Visit: Payer: Managed Care, Other (non HMO) | Admitting: Urgent Care

## 2023-09-27 ENCOUNTER — Other Ambulatory Visit: Payer: Self-pay | Admitting: Surgery

## 2023-09-27 ENCOUNTER — Encounter: Payer: Self-pay | Admitting: Surgery

## 2023-09-27 ENCOUNTER — Other Ambulatory Visit: Payer: Self-pay

## 2023-09-27 ENCOUNTER — Ambulatory Visit
Admission: RE | Admit: 2023-09-27 | Discharge: 2023-09-27 | Disposition: A | Discharge status: Home / Self Care | Payer: Managed Care, Other (non HMO) | Attending: Surgery | Admitting: Surgery

## 2023-09-27 ENCOUNTER — Ambulatory Visit: Payer: Managed Care, Other (non HMO)

## 2023-09-27 ENCOUNTER — Encounter
Admission: RE | Disposition: A | Discharge status: Home / Self Care | Payer: Self-pay | Source: Home / Self Care | Attending: Surgery

## 2023-09-27 DIAGNOSIS — K420 Umbilical hernia with obstruction, without gangrene: Secondary | ICD-10-CM | POA: Insufficient documentation

## 2023-09-27 DIAGNOSIS — K429 Umbilical hernia without obstruction or gangrene: Secondary | ICD-10-CM | POA: Diagnosis not present

## 2023-09-27 HISTORY — PX: UMBILICAL HERNIA REPAIR: SHX196

## 2023-09-27 LAB — POCT PREGNANCY, URINE: Preg Test, Ur: NEGATIVE

## 2023-09-27 SURGERY — REPAIR, HERNIA, UMBILICAL, ADULT
Anesthesia: General

## 2023-09-27 MED ORDER — GABAPENTIN 300 MG PO CAPS
300.0000 mg | ORAL_CAPSULE | ORAL | Status: AC
  Administered 2023-09-27: 300 mg via ORAL

## 2023-09-27 MED ORDER — FENTANYL CITRATE (PF) 100 MCG/2ML IJ SOLN
INTRAMUSCULAR | Status: AC
  Filled 2023-09-27: qty 2

## 2023-09-27 MED ORDER — CHLORHEXIDINE GLUCONATE CLOTH 2 % EX PADS: 6.0000 | MEDICATED_PAD | Freq: Once | CUTANEOUS | Status: DC

## 2023-09-27 MED ORDER — LIDOCAINE HCL (CARDIAC) PF 100 MG/5ML IV SOSY
PREFILLED_SYRINGE | INTRAVENOUS | Status: DC | PRN
  Administered 2023-09-27: 100 mg via INTRAVENOUS

## 2023-09-27 MED ORDER — ACETAMINOPHEN 10 MG/ML IV SOLN: 1000.0000 mg | Freq: Once | INTRAVENOUS | Status: DC | PRN

## 2023-09-27 MED ORDER — OXYCODONE HCL 5 MG PO TABS
ORAL_TABLET | ORAL | Status: AC
  Filled 2023-09-27: qty 1

## 2023-09-27 MED ORDER — SUCCINYLCHOLINE CHLORIDE 200 MG/10ML IV SOSY
PREFILLED_SYRINGE | INTRAVENOUS | Status: AC
  Filled 2023-09-27: qty 10

## 2023-09-27 MED ORDER — IBUPROFEN 800 MG PO TABS: 800.0000 mg | ORAL_TABLET | Freq: Three times a day (TID) | ORAL | 0 refills | Status: DC | PRN

## 2023-09-27 MED ORDER — OXYCODONE HCL 5 MG/5ML PO SOLN: 5.0000 mg | Freq: Once | ORAL | Status: AC | PRN

## 2023-09-27 MED ORDER — CHLORHEXIDINE GLUCONATE 0.12 % MT SOLN
15.0000 mL | Freq: Once | OROMUCOSAL | Status: AC
  Administered 2023-09-27: 15 mL via OROMUCOSAL

## 2023-09-27 MED ORDER — MIDAZOLAM HCL 2 MG/2ML IJ SOLN
INTRAMUSCULAR | Status: DC | PRN
  Administered 2023-09-27: 2 mg via INTRAVENOUS

## 2023-09-27 MED ORDER — CEFAZOLIN SODIUM-DEXTROSE 2-4 GM/100ML-% IV SOLN
2.0000 g | INTRAVENOUS | Status: AC
  Administered 2023-09-27: 2 g via INTRAVENOUS

## 2023-09-27 MED ORDER — DEXAMETHASONE SODIUM PHOSPHATE 10 MG/ML IJ SOLN
INTRAMUSCULAR | Status: DC | PRN
  Administered 2023-09-27: 10 mg via INTRAVENOUS

## 2023-09-27 MED ORDER — FENTANYL CITRATE (PF) 100 MCG/2ML IJ SOLN
25.0000 ug | INTRAMUSCULAR | Status: DC | PRN
  Administered 2023-09-27 (×3): 25 ug via INTRAVENOUS

## 2023-09-27 MED ORDER — BUPIVACAINE-EPINEPHRINE 0.25% -1:200000 IJ SOLN
INTRAMUSCULAR | Status: DC | PRN
  Administered 2023-09-27: 21 mL

## 2023-09-27 MED ORDER — 0.9 % SODIUM CHLORIDE (POUR BTL) OPTIME
TOPICAL | Status: DC | PRN
  Administered 2023-09-27: 500 mL

## 2023-09-27 MED ORDER — ACETAMINOPHEN 500 MG PO TABS
ORAL_TABLET | ORAL | Status: AC
  Filled 2023-09-27: qty 2

## 2023-09-27 MED ORDER — DROPERIDOL 2.5 MG/ML IJ SOLN: 0.6250 mg | Freq: Once | INTRAMUSCULAR | Status: DC | PRN

## 2023-09-27 MED ORDER — DEXAMETHASONE SODIUM PHOSPHATE 10 MG/ML IJ SOLN
INTRAMUSCULAR | Status: AC
  Filled 2023-09-27: qty 1

## 2023-09-27 MED ORDER — PROPOFOL 1000 MG/100ML IV EMUL
INTRAVENOUS | Status: AC
  Filled 2023-09-27: qty 100

## 2023-09-27 MED ORDER — MIDAZOLAM HCL 2 MG/2ML IJ SOLN
INTRAMUSCULAR | Status: AC
  Filled 2023-09-27: qty 2

## 2023-09-27 MED ORDER — BUPIVACAINE-EPINEPHRINE (PF) 0.25% -1:200000 IJ SOLN
INTRAMUSCULAR | Status: AC
  Filled 2023-09-27: qty 30

## 2023-09-27 MED ORDER — LACTATED RINGERS IV SOLN: INTRAVENOUS | Status: DC

## 2023-09-27 MED ORDER — PHENYLEPHRINE 80 MCG/ML (10ML) SYRINGE FOR IV PUSH (FOR BLOOD PRESSURE SUPPORT)
PREFILLED_SYRINGE | INTRAVENOUS | Status: AC
  Filled 2023-09-27: qty 10

## 2023-09-27 MED ORDER — CEFAZOLIN SODIUM-DEXTROSE 2-4 GM/100ML-% IV SOLN
INTRAVENOUS | Status: AC
  Filled 2023-09-27: qty 100

## 2023-09-27 MED ORDER — HYDROCODONE-ACETAMINOPHEN 5-325 MG PO TABS: 1.0000 | ORAL_TABLET | Freq: Four times a day (QID) | ORAL | 0 refills | Status: DC | PRN

## 2023-09-27 MED ORDER — ONDANSETRON HCL 4 MG/2ML IJ SOLN
INTRAMUSCULAR | Status: AC
  Filled 2023-09-27: qty 2

## 2023-09-27 MED ORDER — CHLORHEXIDINE GLUCONATE 0.12 % MT SOLN
OROMUCOSAL | Status: AC
  Filled 2023-09-27: qty 15

## 2023-09-27 MED ORDER — ONDANSETRON HCL 4 MG/2ML IJ SOLN
INTRAMUSCULAR | Status: DC | PRN
  Administered 2023-09-27: 4 mg via INTRAVENOUS

## 2023-09-27 MED ORDER — LIDOCAINE HCL (PF) 2 % IJ SOLN
INTRAMUSCULAR | Status: AC
  Filled 2023-09-27: qty 5

## 2023-09-27 MED ORDER — IBUPROFEN 800 MG PO TABS: 800.0000 mg | ORAL_TABLET | Freq: Three times a day (TID) | ORAL | 0 refills | Status: AC | PRN

## 2023-09-27 MED ORDER — FENTANYL CITRATE (PF) 100 MCG/2ML IJ SOLN
INTRAMUSCULAR | Status: DC | PRN
  Administered 2023-09-27 (×2): 50 ug via INTRAVENOUS

## 2023-09-27 MED ORDER — PROPOFOL 10 MG/ML IV BOLUS
INTRAVENOUS | Status: DC | PRN
  Administered 2023-09-27: 150 ug/kg/min via INTRAVENOUS
  Administered 2023-09-27: 50 mg via INTRAVENOUS
  Administered 2023-09-27: 150 mg via INTRAVENOUS

## 2023-09-27 MED ORDER — KETOROLAC TROMETHAMINE 30 MG/ML IJ SOLN
INTRAMUSCULAR | Status: AC
  Filled 2023-09-27: qty 1

## 2023-09-27 MED ORDER — OXYCODONE HCL 5 MG PO TABS
5.0000 mg | ORAL_TABLET | Freq: Once | ORAL | Status: AC | PRN
  Administered 2023-09-27: 5 mg via ORAL

## 2023-09-27 MED ORDER — ORAL CARE MOUTH RINSE: 15.0000 mL | Freq: Once | OROMUCOSAL | Status: AC

## 2023-09-27 MED ORDER — BUPIVACAINE LIPOSOME 1.3 % IJ SUSP: 20.0000 mL | Freq: Once | INTRAMUSCULAR | Status: DC

## 2023-09-27 MED ORDER — GABAPENTIN 300 MG PO CAPS
ORAL_CAPSULE | ORAL | Status: AC
  Filled 2023-09-27: qty 1

## 2023-09-27 MED ORDER — ACETAMINOPHEN 500 MG PO TABS
1000.0000 mg | ORAL_TABLET | ORAL | Status: AC
  Administered 2023-09-27: 1000 mg via ORAL

## 2023-09-27 MED ORDER — SUCCINYLCHOLINE CHLORIDE 200 MG/10ML IV SOSY
PREFILLED_SYRINGE | INTRAVENOUS | Status: DC | PRN
  Administered 2023-09-27: 100 mg via INTRAVENOUS

## 2023-09-27 SURGICAL SUPPLY — 23 items
BLADE SURG 15 STRL LF DISP TIS (BLADE) ×1 IMPLANT
DERMABOND ADVANCED .7 DNX12 (GAUZE/BANDAGES/DRESSINGS) ×1 IMPLANT
DRAPE LAPAROTOMY 77X122 PED (DRAPES) ×1 IMPLANT
ELECT CAUTERY BLADE 6.4 (BLADE) ×1 IMPLANT
ELECT REM PT RETURN 9FT ADLT (ELECTROSURGICAL) ×1
ELECTRODE REM PT RTRN 9FT ADLT (ELECTROSURGICAL) ×1 IMPLANT
GLOVE ORTHO TXT STRL SZ7.5 (GLOVE) ×1 IMPLANT
GOWN STRL REUS W/ TWL LRG LVL3 (GOWN DISPOSABLE) ×1 IMPLANT
GOWN STRL REUS W/ TWL XL LVL3 (GOWN DISPOSABLE) ×1 IMPLANT
KIT TURNOVER KIT A (KITS) ×1 IMPLANT
MANIFOLD NEPTUNE II (INSTRUMENTS) ×1 IMPLANT
NDL HYPO 22X1.5 SAFETY MO (MISCELLANEOUS) ×1 IMPLANT
NEEDLE HYPO 22X1.5 SAFETY MO (MISCELLANEOUS) ×1
NS IRRIG 500ML POUR BTL (IV SOLUTION) ×1 IMPLANT
PACK BASIN MINOR ARMC (MISCELLANEOUS) ×1 IMPLANT
SPIKE FLUID TRANSFER (MISCELLANEOUS) ×1 IMPLANT
SUT ETHIBOND 0 MO6 C/R (SUTURE) ×1 IMPLANT
SUT MNCRL 4-0 27 PS-2 XMFL (SUTURE) ×1
SUT MNCRL 4-0 27XMFL (SUTURE) ×1
SUT VIC AB 3-0 SH 27X BRD (SUTURE) ×1 IMPLANT
SUTURE MNCRL 4-0 27XMF (SUTURE) ×1 IMPLANT
SYR 10ML LL (SYRINGE) ×1 IMPLANT
TRAP FLUID SMOKE EVACUATOR (MISCELLANEOUS) ×1 IMPLANT

## 2023-09-27 NOTE — Op Note (Signed)
Umbilical Hernia Repair  Pre-operative Diagnosis: Umbilical hernia  Post-operative Diagnosis: same  Surgeon: Campbell Lerner, MD FACS  Anesthesia: General   Findings: 1 cm fascial defect diameter    Estimated Blood Loss: <5 mL                 Specimens: sac, and incarcerated pre-peritoneal fat discarded.         Complications: none              Procedure Details  The patient was seen again in the Holding Room. The benefits, complications, treatment options, and expected outcomes were discussed with the patient. The risks of bleeding, infection, recurrence of symptoms, failure to resolve symptoms, bowel injury, mesh placement, mesh infection, any of which could require further surgery were reviewed with the patient. The likelihood of improving the patient's symptoms with return to their baseline status is good.  The patient and/or family concurred with the proposed plan, giving informed consent.  The patient was taken to Operating Room, identified, and the procedure verified.  A Time Out was held and the above information confirmed.  Prior to the induction of general anesthesia, antibiotic prophylaxis was administered. VTE prophylaxis was in place. General endotracheal anesthesia was then administered and tolerated well. After the induction, the abdomen was prepped with Chloraprep and draped in the sterile fashion. The patient was positioned in the supine position.  Incision was created with a scalpel over the hernia defect. Electrocautery was used to dissect through subcutaneous tissue, the hernia sac was opened and the incarcerated preperitoneal adipose tissue excised. The hernia was measured.  I closed the hernia defect with interrupted 0 Ethibond sutures in a simple fashion.   Marcaine quarter percent with epinephrine was used to inject the site.   Incision was closed with 4-0 Monocryl. Dermabond was used to coat the skin. Patient tolerated procedure well and there were no immediate  complications. Needle and laparotomy counts were correct   Campbell Lerner, M.D., [facility]  09/27/2023 ; 10:34 AM

## 2023-09-27 NOTE — Anesthesia Preprocedure Evaluation (Signed)
Anesthesia Evaluation  Patient identified by MRN, date of birth, ID band Patient awake    Reviewed: Allergy & Precautions, H&P , NPO status , Patient's Chart, lab work & pertinent test results, reviewed documented beta blocker date and time   Airway Mallampati: II  TM Distance: >3 FB Neck ROM: full    Dental  (+) Teeth Intact   Pulmonary neg pulmonary ROS   Pulmonary exam normal        Cardiovascular Exercise Tolerance: Poor negative cardio ROS Normal cardiovascular exam Rhythm:regular Rate:Normal     Neuro/Psych  Headaches  negative psych ROS   GI/Hepatic Neg liver ROS,GERD  ,,  Endo/Other  negative endocrine ROS    Renal/GU negative Renal ROS  negative genitourinary   Musculoskeletal   Abdominal   Peds  Hematology negative hematology ROS (+)   Anesthesia Other Findings Past Medical History: 12/05/2021: Acute hemorrhagic colitis No date: GERD (gastroesophageal reflux disease)     Comment:  occ No date: Hx of migraines No date: Umbilical hernia without obstruction and without gangrene Past Surgical History: No date: BREAST ENHANCEMENT SURGERY No date: COLONOSCOPY No date: LYMPH NODE BIOPSY No date: TONSILLECTOMY AND ADENOIDECTOMY BMI    Body Mass Index: 28.06 kg/m     Reproductive/Obstetrics negative OB ROS                              Anesthesia Physical Anesthesia Plan  ASA: 2  Anesthesia Plan: General ETT   Post-op Pain Management:    Induction:   PONV Risk Score and Plan: 4 or greater  Airway Management Planned:   Additional Equipment:   Intra-op Plan:   Post-operative Plan:   Informed Consent: I have reviewed the patients History and Physical, chart, labs and discussed the procedure including the risks, benefits and alternatives for the proposed anesthesia with the patient or authorized representative who has indicated his/her understanding and acceptance.      Dental Advisory Given  Plan Discussed with: CRNA  Anesthesia Plan Comments:          Anesthesia Quick Evaluation

## 2023-09-27 NOTE — Progress Notes (Signed)
[facility] is closed today.

## 2023-09-27 NOTE — Transfer of Care (Signed)
Immediate Anesthesia Transfer of Care Note  Patient: Emma Gross  Procedure(s) Performed: HERNIA REPAIR UMBILICAL ADULT  Patient Location: PACU  Anesthesia Type:General  Level of Consciousness: awake and drowsy  Airway & Oxygen Therapy: Patient Spontanous Breathing  Post-op Assessment: Report given to RN and Post -op Vital signs reviewed and stable  Post vital signs: Reviewed and stable  Last Vitals:  Vitals Value Taken Time  BP 117/81 09/27/23 1033  Temp    Pulse 108 09/27/23 1036  Resp 20 09/27/23 1036  SpO2 96 % 09/27/23 1036  Vitals shown include unfiled device data.  Last Pain:  Vitals:   09/27/23 0904  TempSrc: Temporal         Complications: There were no known notable events for this encounter.

## 2023-09-27 NOTE — Anesthesia Procedure Notes (Signed)
Procedure Name: Intubation Date/Time: 09/27/2023 9:48 AM  Performed by: Lysbeth Penner, CRNAPre-anesthesia Checklist: Patient identified, Emergency Drugs available, Suction available and Patient being monitored Patient Re-evaluated:Patient Re-evaluated prior to induction Oxygen Delivery Method: Circle system utilized Preoxygenation: Pre-oxygenation with 100% oxygen Induction Type: IV induction Ventilation: Mask ventilation without difficulty Laryngoscope Size: McGrath and 3 Grade View: Grade I Tube type: Oral Tube size: 6.5 mm Number of attempts: 1 Airway Equipment and Method: Stylet and Oral airway Placement Confirmation: ETT inserted through vocal cords under direct vision, positive ETCO2 and breath sounds checked- equal and bilateral Secured at: 20 cm Tube secured with: Tape Dental Injury: Teeth and Oropharynx as per pre-operative assessment

## 2023-09-27 NOTE — Anesthesia Postprocedure Evaluation (Signed)
Anesthesia Post Note  Patient: Emma Gross  Procedure(s) Performed: HERNIA REPAIR UMBILICAL ADULT  Patient location during evaluation: PACU Anesthesia Type: General Level of consciousness: awake and alert Pain management: pain level controlled Vital Signs Assessment: post-procedure vital signs reviewed and stable Respiratory status: spontaneous breathing, nonlabored ventilation, respiratory function stable and patient connected to nasal cannula oxygen Cardiovascular status: blood pressure returned to baseline and stable Postop Assessment: no apparent nausea or vomiting Anesthetic complications: no   There were no known notable events for this encounter.   Last Vitals:  Vitals:   09/27/23 0904  BP: (!) 167/98  Pulse: 83  Resp: 18  Temp: (!) 36.2 C  SpO2: 100%    Last Pain:  Vitals:   09/27/23 0904  TempSrc: Temporal                 Yevette Edwards

## 2023-09-27 NOTE — Interval H&P Note (Signed)
History and Physical Interval Note:  09/27/2023 9:29 AM  Emma Gross  has presented today for surgery, with the diagnosis of umbilical hernia initial reducible less 3 cm.  The various methods of treatment have been discussed with the patient and family. After consideration of risks, benefits and other options for treatment, the patient has consented to  Procedure(s): HERNIA REPAIR UMBILICAL ADULT (N/A) as a surgical intervention.  The patient's history has been reviewed, patient examined, no change in status, stable for surgery.  I have reviewed the patient's chart and labs.  Questions were answered to the patient's satisfaction.     Campbell Lerner

## 2023-09-28 ENCOUNTER — Encounter: Payer: Self-pay | Admitting: Surgery

## 2023-10-04 ENCOUNTER — Telehealth: Payer: Self-pay | Admitting: *Deleted

## 2023-10-04 NOTE — Telephone Encounter (Signed)
Faxed FMLA to [employer] at [phone]

## 2023-10-12 ENCOUNTER — Encounter: Payer: Self-pay | Admitting: Surgery

## 2023-10-12 ENCOUNTER — Ambulatory Visit: Payer: Managed Care, Other (non HMO) | Admitting: Surgery

## 2023-10-12 VITALS — BP 147/80 | HR 90 | Temp 98.0°F | Ht 69.0 in | Wt 196.0 lb

## 2023-10-12 DIAGNOSIS — R1031 Right lower quadrant pain: Secondary | ICD-10-CM

## 2023-10-12 DIAGNOSIS — Z09 Encounter for follow-up examination after completed treatment for conditions other than malignant neoplasm: Secondary | ICD-10-CM | POA: Diagnosis not present

## 2023-10-12 DIAGNOSIS — K429 Umbilical hernia without obstruction or gangrene: Secondary | ICD-10-CM | POA: Diagnosis not present

## 2023-10-12 NOTE — Progress Notes (Signed)
 [facility] POST-OP OFFICE VISIT  10/12/2023  HPI: Emma Gross is a 53 y.o. female had surgery on September 27, 2023, now s/p primary repair of small umbilical hernia defect.  Now reports right lower quadrant tenderness, that seems to be exacerbated by activity.  She currently takes an ibuprofen  when it flares up and lays down to rest it off.  Reports it is quite constant, nonradiating, without provocative features, however made better with rest and immobility.  She denies fevers and chills, she reports daily usual bowel activity without issue.  She denies nausea or vomiting.  Vital signs: BP (!) 147/80   Pulse 90   Temp 98 F (36.7 C)   Ht 5' 9 (1.753 m)   Wt 196 lb (88.9 kg)   SpO2 99%   BMI 28.94 kg/m    Physical Exam: Constitutional: She appears well. Abdomen: She has some voluntary guarding to the right lower quadrant quite consistently.  Unable to fully distract her. Skin: Umbilical incision clean dry and intact.  Assessment/Plan: This is a 53 y.o. female status post primary repair of small umbilical fascial defect on September 27, 2023.  Now with unexplained right lower quadrant pain.  No obvious appreciable inguinal bulge.  No history of hysterectomy or oophorectomy on the right side.  Patient Active Problem List   Diagnosis Date Noted   Umbilical hernia without obstruction and without gangrene 09/16/2023   Hypokalemia 12/06/2021   Hematochezia 12/06/2021   Migraine 12/05/2021   Insomnia 12/05/2021    -We will obtain CT abdomen pelvis to ensure etiology of right lower quadrant abdominal pain/tenderness.  Will reach out by phone when results are available.  For now we will not return to work.   Honor Leghorn M.D., FACS 10/12/2023, 9:09 AM

## 2023-10-12 NOTE — Patient Instructions (Addendum)
 We will get you scheduled for a CT scan to better look at this area.   You are scheduled for a CT scan at [facility] on 10/18/23. You will need to arrive at the [facility] entrance at 2:00 pm.   We will call you with the results.   GENERAL POST-OPERATIVE PATIENT INSTRUCTIONS   WOUND CARE INSTRUCTIONS:  Keep a dry clean dressing on the wound if there is drainage. The initial bandage may be removed after 24 hours.  Once the wound has quit draining you may leave it open to air.  If clothing rubs against the wound or causes irritation and the wound is not draining you may cover it with a dry dressing during the daytime.  Try to keep the wound dry and avoid ointments on the wound unless directed to do so.  If the wound becomes bright red and painful or starts to drain infected material that is not clear, please contact your physician immediately.  If the wound is mildly pink and has a thick firm ridge underneath it, this is normal, and is referred to as a healing ridge.  This will resolve over the next 4-6 weeks.  BATHING: You may shower if you have been informed of this by your surgeon. However, Please do not submerge in a tub, hot tub, or pool until incisions are completely sealed or have been told by your surgeon that you may do so.  DIET:  You may eat any foods that you can tolerate.  It is a good idea to eat a high fiber diet and take in plenty of fluids to prevent constipation.  If you do become constipated you may want to take a mild laxative or take ducolax tablets on a daily basis until your bowel habits are regular.  Constipation can be very uncomfortable, along with straining, after recent surgery.  ACTIVITY:  You are encouraged to cough and deep breath or use your incentive spirometer if you were given one, every 15-30 minutes when awake.  This will help prevent respiratory complications and low grade fevers post-operatively if you had a general anesthetic.  You may want to hug a pillow when  coughing and sneezing to add additional support to the surgical area, if you had abdominal or chest surgery, which will decrease pain during these times.  You are encouraged to walk and engage in light activity for the next two weeks.  You should not lift more than 20 pounds for 6 weeks total after surgery as it could put you at increased risk for complications.  Twenty pounds is roughly equivalent to a plastic bag of groceries. At that time- Listen to your body when lifting, if you have pain when lifting, stop and then try again in a few days. Soreness after doing exercises or activities of daily living is normal as you get back in to your normal routine.  MEDICATIONS:  Try to take narcotic medications and anti-inflammatory medications, such as tylenol , ibuprofen , naprosyn, etc., with food.  This will minimize stomach upset from the medication.  Should you develop nausea and vomiting from the pain medication, or develop a rash, please discontinue the medication and contact your physician.  You should not drive, make important decisions, or operate machinery when taking narcotic pain medication.  SUNBLOCK Use sun block to incision area over the next year if this area will be exposed to sun. This helps decrease scarring and will allow you avoid a permanent darkened area over your incision.  QUESTIONS:  Please feel free to call our office if you have any questions, and we will be glad to assist you. [phone]

## 2023-10-17 ENCOUNTER — Telehealth: Payer: Self-pay | Admitting: *Deleted

## 2023-10-17 NOTE — Telephone Encounter (Signed)
Faxed Medical Leave paperwork to [place] at [phone] and HR Team- [facility] [phone]

## 2023-10-18 ENCOUNTER — Ambulatory Visit
Admission: RE | Admit: 2023-10-18 | Discharge: 2023-10-18 | Disposition: A | Discharge status: Home / Self Care | Payer: Managed Care, Other (non HMO) | Source: Ambulatory Visit | Attending: Surgery | Admitting: Surgery

## 2023-10-18 DIAGNOSIS — K429 Umbilical hernia without obstruction or gangrene: Secondary | ICD-10-CM | POA: Diagnosis present

## 2023-10-18 MED ORDER — IOHEXOL 300 MG/ML  SOLN
100.0000 mL | Freq: Once | INTRAMUSCULAR | Status: AC | PRN
  Administered 2023-10-18: 100 mL via INTRAVENOUS

## 2023-10-30 ENCOUNTER — Telehealth: Payer: Self-pay | Admitting: Surgery

## 2023-10-30 NOTE — Telephone Encounter (Signed)
 Reported CT findings via VM for pt's benefit.  F/u as needed, may resume full activity w/o restrictions.
